# Patient Record
Sex: Male | Born: 1960 | Race: Black or African American | Hispanic: No | Marital: Single | Smoking: Never smoker
Health system: Southern US, Community
[De-identification: ages and names within clinical notes are randomized; demographics above are authoritative.]

## PROBLEM LIST (undated history)

## (undated) DIAGNOSIS — M545 Low back pain, unspecified: Secondary | ICD-10-CM

## (undated) DIAGNOSIS — G8929 Other chronic pain: Secondary | ICD-10-CM

## (undated) DIAGNOSIS — C189 Malignant neoplasm of colon, unspecified: Secondary | ICD-10-CM

## (undated) DIAGNOSIS — I82409 Acute embolism and thrombosis of unspecified deep veins of unspecified lower extremity: Secondary | ICD-10-CM

## (undated) DIAGNOSIS — M542 Cervicalgia: Secondary | ICD-10-CM

## (undated) HISTORY — DX: Other chronic pain: G89.29

## (undated) HISTORY — DX: Low back pain, unspecified: M54.50

## (undated) HISTORY — DX: Low back pain: M54.5

## (undated) HISTORY — PX: CERVICAL FUSION: SHX112

## (undated) HISTORY — DX: Acute embolism and thrombosis of unspecified deep veins of unspecified lower extremity: I82.409

---

## 1997-08-23 ENCOUNTER — Emergency Department (HOSPITAL_COMMUNITY): Admission: EM | Admit: 1997-08-23 | Discharge: 1997-08-23 | Payer: Self-pay | Admitting: *Deleted

## 1997-11-22 ENCOUNTER — Inpatient Hospital Stay (HOSPITAL_COMMUNITY): Admission: RE | Admit: 1997-11-22 | Discharge: 1997-11-23 | Payer: Self-pay | Admitting: Neurosurgery

## 2003-11-25 ENCOUNTER — Emergency Department (HOSPITAL_COMMUNITY): Admission: EM | Admit: 2003-11-25 | Discharge: 2003-11-25 | Payer: Self-pay | Admitting: Emergency Medicine

## 2004-05-20 ENCOUNTER — Ambulatory Visit (HOSPITAL_COMMUNITY): Admission: RE | Admit: 2004-05-20 | Discharge: 2004-05-20 | Payer: Self-pay | Admitting: Family Medicine

## 2005-06-15 ENCOUNTER — Encounter: Payer: Self-pay | Admitting: Neurosurgery

## 2009-12-24 ENCOUNTER — Emergency Department (HOSPITAL_COMMUNITY): Admission: EM | Admit: 2009-12-24 | Discharge: 2009-12-24 | Payer: Self-pay | Admitting: Emergency Medicine

## 2010-01-23 ENCOUNTER — Emergency Department (HOSPITAL_COMMUNITY)
Admission: EM | Admit: 2010-01-23 | Discharge: 2010-01-23 | Payer: Self-pay | Source: Home / Self Care | Admitting: Emergency Medicine

## 2010-02-14 DIAGNOSIS — I82409 Acute embolism and thrombosis of unspecified deep veins of unspecified lower extremity: Secondary | ICD-10-CM

## 2010-02-14 HISTORY — DX: Acute embolism and thrombosis of unspecified deep veins of unspecified lower extremity: I82.409

## 2010-05-11 ENCOUNTER — Emergency Department (HOSPITAL_COMMUNITY): Payer: Self-pay

## 2010-05-11 ENCOUNTER — Emergency Department (HOSPITAL_COMMUNITY)
Admission: EM | Admit: 2010-05-11 | Discharge: 2010-05-11 | Disposition: A | Discharge status: Home / Self Care | Payer: Self-pay | Attending: Emergency Medicine | Admitting: Emergency Medicine

## 2010-05-11 DIAGNOSIS — M79609 Pain in unspecified limb: Secondary | ICD-10-CM | POA: Insufficient documentation

## 2010-05-11 DIAGNOSIS — M5430 Sciatica, unspecified side: Secondary | ICD-10-CM | POA: Insufficient documentation

## 2010-06-25 ENCOUNTER — Observation Stay (HOSPITAL_COMMUNITY)
Admission: EM | Admit: 2010-06-25 | Discharge: 2010-06-28 | Disposition: A | Discharge status: Home / Self Care | Payer: Self-pay | Attending: Internal Medicine | Admitting: Internal Medicine

## 2010-06-25 ENCOUNTER — Emergency Department (HOSPITAL_COMMUNITY): Payer: Self-pay

## 2010-06-25 DIAGNOSIS — I825Y9 Chronic embolism and thrombosis of unspecified deep veins of unspecified proximal lower extremity: Secondary | ICD-10-CM | POA: Insufficient documentation

## 2010-06-25 DIAGNOSIS — I824Y9 Acute embolism and thrombosis of unspecified deep veins of unspecified proximal lower extremity: Principal | ICD-10-CM | POA: Insufficient documentation

## 2010-06-25 DIAGNOSIS — M545 Low back pain, unspecified: Secondary | ICD-10-CM | POA: Insufficient documentation

## 2010-06-25 DIAGNOSIS — D696 Thrombocytopenia, unspecified: Secondary | ICD-10-CM | POA: Insufficient documentation

## 2010-06-25 DIAGNOSIS — G8929 Other chronic pain: Secondary | ICD-10-CM | POA: Insufficient documentation

## 2010-06-25 LAB — DIFFERENTIAL
Basophils Relative: 0 % (ref 0–1)
Monocytes Absolute: 0.5 10*3/uL (ref 0.1–1.0)
Monocytes Relative: 8 % (ref 3–12)
Neutro Abs: 3.3 10*3/uL (ref 1.7–7.7)

## 2010-06-25 LAB — CBC
HCT: 41.9 % (ref 39.0–52.0)
Hemoglobin: 13.7 g/dL (ref 13.0–17.0)
MCH: 30.8 pg (ref 26.0–34.0)
MCHC: 32.7 g/dL (ref 30.0–36.0)
MCV: 94.2 fL (ref 78.0–100.0)

## 2010-06-25 LAB — PROTIME-INR: Prothrombin Time: 12.4 seconds (ref 11.6–15.2)

## 2010-06-26 LAB — BASIC METABOLIC PANEL
CO2: 29 mEq/L (ref 19–32)
GFR calc Af Amer: >60 mL/min (ref >60)
Glucose, Bld: 96 mg/dL (ref 70–99)
Potassium: 4.3 mEq/L (ref 3.5–5.1)
Sodium: 136 mEq/L (ref 135–145)

## 2010-06-26 LAB — DIFFERENTIAL
Lymphocytes Relative: 39 % (ref 12–46)
Lymphs Abs: 2.4 10*3/uL (ref 0.7–4.0)
Monocytes Relative: 10 % (ref 3–12)
Neutro Abs: 3 10*3/uL (ref 1.7–7.7)
Neutrophils Relative %: 48 % (ref 43–77)

## 2010-06-26 LAB — CBC
HCT: 41.7 % (ref 39.0–52.0)
Hemoglobin: 13.7 g/dL (ref 13.0–17.0)
MCH: 30.9 pg (ref 26.0–34.0)
MCV: 94.1 fL (ref 78.0–100.0)
RBC: 4.43 MIL/uL (ref 4.22–5.81)

## 2010-06-26 NOTE — H&P (Signed)
Danny Fox, Danny Fox               ACCOUNT NO.:  [id number]  MEDICAL RECORD NO.:  [id number]           PATIENT TYPE:  O  LOCATION:  A206                          FACILITY:  [facility]  PHYSICIAN:  Hillery Aldo, M.D.   DATE OF BIRTH:  Nov 19, 1960  DATE OF ADMISSION:  06/25/2010 DATE OF DISCHARGE:  LH                             HISTORY & PHYSICAL   PRIMARY CARE PHYSICIAN:  None.  CHIEF COMPLAINT:  Right lower extremity pain.  HISTORY OF PRESENT ILLNESS:  The patient is a 49 year old male with 5- day history of worsening right lower extremity pain.  The patient initially thought he may have pulled a calf muscle but denies any reports of recent injury to the lower extremity.  He has not had any recent travel and there is no family history of clotting disorders. Upon initial evaluation in the emergency department, an ultrasound confirmed right lower extremity DVT and he has been referred to the Hospitalist Service for further evaluation and initiation of treatment. The patient denies any recent fever, chills, chest pain, or dyspnea.  PAST MEDICAL HISTORY:  Chronic neck and lower back pain status post anterior fusion.  FAMILY HISTORY:  The patient's mother is alive at 5 and has diabetes and hypertension.  The patient's stepfather is alive at 54, but he is unaware of his biological father's past medical history.  He has one brother with prostate cancer and a sister with pancreatic cancer.  Other siblings are healthy.  SOCIAL HISTORY:  The patient is divorced and currently lives with his mother.  He is lifelong nonsmoker.  He drinks one 40 ounce beer approximately every 2 days.  He occasionally smokes marijuana.  He is currently unemployed.  ALLERGIES:  No known drug allergies.  CURRENT MEDICATIONS:  None.  REVIEW OF SYSTEMS:  CONSTITUTIONAL:  No fever or chills.  No weight loss or weight gain.  HEENT:  No complaints.  CARDIOVASCULAR:  No chest pain or dysrhythmia.  RESPIRATORY:   No shortness of breath or cough.  GI:  No nausea, vomiting, diarrhea, melena, or hematochezia.  GU:  No dysuria or hematuria.  MUSCULOSKELETAL:  No back pain.  Right lower extremity pain.  Comprehensive 14-point review of systems is otherwise unremarkable.  PHYSICAL EXAMINATION:  VITAL SIGNS:  Temperature 97.5, blood pressure 142/89, pulse 50, respirations 20, O2 saturation 100% on room air. GENERAL:  Well-developed, well-nourished African American male in no acute distress. HEENT:  Normocephalic, atraumatic.  PERRL.  EOMI.  Oropharynx is clear. NECK:  Supple, no thyromegaly, no lymphadenopathy, no jugular venous distention. CHEST:  Lungs clear to auscultation bilaterally.  Good air movement. HEART:  Regular rate, rhythm.  No murmurs, rubs, or gallops. ABDOMEN:  Soft, nontender, nondistended with normoactive bowel sounds. EXTREMITIES:  Swelling and tenderness to the right lower extremity. Pedal pulses are 2+ bilaterally. NEUROLOGIC:  The patient is alert and oriented x3.  Cranial nerves II- XII are grossly intact.  Nonfocal.  DATA REVIEW:  Right lower extremity ultrasound shows DVT involving the popliteal vein below the knee and the posterior tibial vein to the calf. This most likely chronic as there is prominent  collateral vein and partial recannulization of the popliteal vein and posterior tibial veins.  LABORATORY DATA:  White blood cell count is 6.4, hemoglobin 13.7, hematocrit 41.9, platelets 134.  ASSESSMENT AND PLAN: 1. 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  We will check hypercoagulability profile prior to the     initiation of therapeutic anticoagulation since this is an     unprovoked event.  We will teach the patient how to self-administer     Lovenox and obtain a social worker consultation for help with     sitting up appropriate hospital followup and access to  Lovenox     since he is currently uninsured. 2. Thrombocytopenia:  The patient has a mild thrombocytopenia.  No     evidence of bleeding diathesis at this time. 3. Chronic lower back pain:  Currently asymptomatic.  Time spent on admission including face-to-face time equals approximately 45 minutes.     Hillery Aldo, M.D.     CR/MEDQ  D:  06/25/2010  T:  06/26/2010  Job:  [id number]  Electronically Signed by Hillery Aldo M.D. on 06/26/2010 04:31:16 PM

## 2010-06-27 LAB — CBC
HCT: 43.7 % (ref 39.0–52.0)
Hemoglobin: 14.4 g/dL (ref 13.0–17.0)
MCV: 93.4 fL (ref 78.0–100.0)
RDW: 13.4 % (ref 11.5–15.5)
WBC: 7.7 10*3/uL (ref 4.0–10.5)

## 2010-06-27 LAB — DIFFERENTIAL
Eosinophils Relative: 1 % (ref 0–5)
Lymphocytes Relative: 37 % (ref 12–46)
Lymphs Abs: 2.9 10*3/uL (ref 0.7–4.0)
Monocytes Absolute: 0.7 10*3/uL (ref 0.1–1.0)
Neutro Abs: 4 10*3/uL (ref 1.7–7.7)

## 2010-06-28 LAB — CBC
HCT: 45.1 % (ref 39.0–52.0)
Hemoglobin: 14.7 g/dL (ref 13.0–17.0)
MCHC: 32.6 g/dL (ref 30.0–36.0)
RBC: 4.83 MIL/uL (ref 4.22–5.81)
WBC: 6.5 10*3/uL (ref 4.0–10.5)

## 2010-06-28 LAB — LUPUS ANTICOAGULANT PANEL
PTT Lupus Anticoagulant: 47.3 secs — ABNORMAL HIGH (ref 30.0–45.6)
PTTLA 4:1 Mix: 45.1 secs (ref 30.0–45.6)

## 2010-06-28 LAB — PROTEIN S ACTIVITY: Protein S Activity: 120 % (ref 69–129)

## 2010-06-28 LAB — ANTITHROMBIN III: AntiThromb III Func: 106 % (ref 76–126)

## 2010-06-28 LAB — PROTEIN S, TOTAL: Protein S Ag, Total: 120 % (ref 60–150)

## 2010-06-28 LAB — DIFFERENTIAL
Basophils Absolute: 0 10*3/uL (ref 0.0–0.1)
Basophils Relative: 0 % (ref 0–1)
Lymphocytes Relative: 41 % (ref 12–46)
Monocytes Absolute: 0.7 10*3/uL (ref 0.1–1.0)
Neutro Abs: 3.1 10*3/uL (ref 1.7–7.7)
Neutrophils Relative %: 47 % (ref 43–77)

## 2010-06-28 LAB — PROTEIN C, TOTAL: Protein C, Total: 91 % (ref 72–160)

## 2010-06-28 LAB — CARDIOLIPIN ANTIBODIES, IGG, IGM, IGA
Anticardiolipin IgA: 7 APL U/mL — ABNORMAL LOW (ref <22)
Anticardiolipin IgG: 6 GPL U/mL — ABNORMAL LOW (ref <23)

## 2010-06-28 LAB — BETA-2-GLYCOPROTEIN I ABS, IGG/M/A
Beta-2 Glyco I IgG: 0 G Units (ref <20)
Beta-2-Glycoprotein I IgA: 8 A Units (ref <20)
Beta-2-Glycoprotein I IgM: 9 M Units (ref <20)

## 2010-06-28 LAB — PROTIME-INR: Prothrombin Time: 14.1 seconds (ref 11.6–15.2)

## 2010-06-29 NOTE — Discharge Summary (Signed)
NAMEJAHNI, NAZAR               ACCOUNT NO.:  [id number]  MEDICAL RECORD NO.:  [id number]           PATIENT TYPE:  O  LOCATION:  A206                          FACILITY:  [facility]  PHYSICIAN:  Hillery Aldo, M.D.   DATE OF BIRTH:  06/16/1960  DATE OF ADMISSION:  06/25/2010 DATE OF DISCHARGE:  05/14/2012LH                              DISCHARGE SUMMARY   PRIMARY CARE PHYSICIAN:  None.  The patient is being referred to the [facility] for hospital followup.  DISCHARGE DIAGNOSES: 1. Acute on chronic deep vein thrombosis of the right lower extremity. 2. Mild transient thrombocytopenia. 3. Chronic lower back pain.  DISCHARGE MEDICATIONS: 1. Coumadin 7.5 mg p.o. daily or as directed by primary care provider. 2. Lovenox 90 mg subcutaneously q.12 hours until instructed to     discontinue. 3. Naprosyn 220 mg p.o. q.6 hours p.r.n. pain. 4. Oxycodone 5-10 mg p.o. q 4 hours p.r.n. pain.  CONSULTATIONS:  None.  BRIEF ADMISSION HISTORY OF PRESENT ILLNESS:  The patient is a 50 year old male with known past medical history of right lower extremity injury or DVT risk factors, who presented to the hospital with pain and swelling of the right lower extremity.  He had a confirmed DVT on initial evaluation in the emergency department, subsequently was referred to the Hospitalist Service for further evaluation and treatment.  For full details, please see my dictated H and P.  PROCEDURES AND DIAGNOSTIC STUDIES: 1. Right lower extremity venous ultrasound on Jun 25, 2010 showed DVT     involving the popliteal vein below the knee and paraposterior     tibial veins of calf.  Suspicion of chronic DVT as there was a     prominent collateral vein and partial recannulization of the     popliteal vein and posterior tibial veins.  DISCHARGE LABORATORY VALUES:  White blood cell count was 6.5, hemoglobin 14.7, hematocrit 45.1, platelets 177.  PT was 14.1 with an INR of 1.07. Chemistries performed  on Jun 26, 2010 were unremarkable and within normal limits.  A hypercoagulable panel is pending at the time of this dictation, although the homocysteine is back at 9.8.  HOSPITAL COURSE BY PROBLEM: 1. Right lower extremity DVT:  The patient was admitted to the     hospital and put on Lovenox and Coumadin.  He was provided with     extensive teaching with regard to dietary restrictions on Coumadin     and had a self-administer Lovenox.  The patient was kept over the     weekend due to not having appropriate hospital followup and to     attempt to obtain a supply of Lovenox for him as he is currently     without insurance.  At this point, the patient will be set up for     hospital followup with the [facility] and we will obtain a     supply of Lovenox to the drug assistance program. 2. Thrombocytopenia:  The patient had mild transient thrombocytopenia     which has resolved. 3. Chronic lower back pain:  He can continue on Naprosyn p.r.n.  DISPOSITION:  The patient is medically stable and will be discharged home.  CONDITION ON DISCHARGE:  Stable.  Time spent coordinating care for discharge and discharge instructions including face-to-face time equals 20 minutes.  DISCHARGE DIET:  Regular.  DISCHARGE INSTRUCTIONS:  Follow up at the [facility] at a scheduled appointment time.  Increase activity slowly.     Hillery Aldo, M.D.     CR/MEDQ  D:  06/28/2010  T:  06/28/2010  Job:  [id number]  Electronically Signed by Hillery Aldo M.D. on 06/29/2010 04:41:51 PM

## 2010-06-30 ENCOUNTER — Encounter: Payer: Self-pay | Admitting: Adult Health

## 2010-06-30 ENCOUNTER — Ambulatory Visit (INDEPENDENT_AMBULATORY_CARE_PROVIDER_SITE_OTHER): Payer: Self-pay | Admitting: Adult Health

## 2010-06-30 ENCOUNTER — Ambulatory Visit (INDEPENDENT_AMBULATORY_CARE_PROVIDER_SITE_OTHER): Payer: Self-pay | Admitting: *Deleted

## 2010-06-30 VITALS — BP 126/81 | HR 51 | Ht 69.0 in | Wt 200.0 lb

## 2010-06-30 DIAGNOSIS — I82409 Acute embolism and thrombosis of unspecified deep veins of unspecified lower extremity: Secondary | ICD-10-CM

## 2010-06-30 DIAGNOSIS — I824Y9 Acute embolism and thrombosis of unspecified deep veins of unspecified proximal lower extremity: Secondary | ICD-10-CM

## 2010-06-30 DIAGNOSIS — Z01818 Encounter for other preprocedural examination: Secondary | ICD-10-CM

## 2010-06-30 DIAGNOSIS — Z7901 Long term (current) use of anticoagulants: Secondary | ICD-10-CM | POA: Insufficient documentation

## 2010-06-30 MED ORDER — OXYCODONE HCL 10 MG PO TABS: 10.0000 mg | ORAL_TABLET | ORAL | Status: DC | PRN

## 2010-06-30 NOTE — Patient Instructions (Signed)
Your physician recommends that you continue on your current medications as directed. Please refer to the Current Medication list given to you today.  Your physician recommends that you schedule a follow-up appointment in: 1 year  

## 2010-06-30 NOTE — Assessment & Plan Note (Signed)
He is doing as expected for chronic DVT. Pain is not well controlled.  Dr. Daleen Squibb has seen and examined this patient.  He is to see him in one year, continue in the coumadin clinic.  He will be given a Rx for oxycodone to assist in pain control. He is to take it every 3 hours.

## 2010-06-30 NOTE — Progress Notes (Signed)
HPI:  [name] is a 50 y/o AAM we are seeing to be established in our clinic in the setting of coumadin therapy for DVT. He was diagnosed in May of 2012 after acute onset of Right Leg pain and swelling. Presented to [facility] and doppler US demonstrated DVT in the popliteal vein below the knee and the paired posterior tibial veins in the calf. It was suspected that this represented chronic DVT.  A hypercoagulable panel was completed and found to be negative.  He comes today and has seen Vashti Hey RN for coumadin INR check and found to have a level of 2.0.  Lovenox bridging is stopped.  He has more complaints of Right LE pain and swelling which is quite difficult for him.  He is otherwise without complaint of DOE, Chest pain or dizziness.,  No Known Allergies  Current Outpatient Prescriptions  Medication Sig Dispense Refill  . naproxen (NAPROSYN) 250 MG tablet Take 250 mg by mouth 2 (two) times daily with a meal.        . Oxycodone HCl 10 MG TABS Take 1 tablet by mouth as needed.        . warfarin (COUMADIN) 5 MG tablet Take by mouth as directed.        . warfarin (COUMADIN) 7.5 MG tablet Take 7.5 mg by mouth daily.          Past Medical History  Diagnosis Date  . Chronic deep vein thrombosis of lower leg     right  . Thrombocytopenia   . Chronic lower back pain     Past Surgical History  Procedure Date  . Anterior lumbar fusion     No family history on file.  History   Social History  . Marital Status: Single    Spouse Name: N/A    Number of Children: N/A  . Years of Education: N/A   Occupational History  . Not on file.   Social History Main Topics  . Smoking status: Current Some Day Smoker  . Smokeless tobacco: Never Used  . Alcohol Use: Yes  . Drug Use: Yes  . Sexually Active: Not on file   Other Topics Concern  . Not on file   Social History Narrative  . No narrative on file    GUY:QIHKVQ of systems complete and found to be negative unless listed  above  PHYSICAL EXAM BP 126/81  Pulse 51  Ht 5\' 9"  (1.753 m)  Wt 200 lb (90.719 kg)  BMI 29.53 kg/m2  SpO2 97% General: Well developed, well nourished, in no acute distress Head: Eyes PERRLA, No xanthomas.   Normal cephalic and atramatic  Lungs: Clear bilaterally to auscultation and percussion. Heart: HRRR S1 S2,.  Pulses are 2+ & equal.            No carotid bruit. No JVD.  No abdominal bruits. No femoral bruits. Abdomen: Bowel sounds are positive, abdomen soft and non-tender without masses or                  Hernia's noted. Msk:  Back normal, normal gait. Normal strength and tone for age. Extremities: No clubbing, cyanosis . Right leg acutely painful, with mild edema.                      No warmth.   Neuro: Alert and oriented X 3. Psych:  Good affect, responds appropriately EKG: Bradycardia 50 bpm.    ASSESSMENT AND PLAN

## 2010-07-01 ENCOUNTER — Ambulatory Visit (INDEPENDENT_AMBULATORY_CARE_PROVIDER_SITE_OTHER): Payer: Self-pay | Admitting: *Deleted

## 2010-07-01 DIAGNOSIS — Z7901 Long term (current) use of anticoagulants: Secondary | ICD-10-CM

## 2010-07-01 DIAGNOSIS — I824Y9 Acute embolism and thrombosis of unspecified deep veins of unspecified proximal lower extremity: Secondary | ICD-10-CM

## 2010-07-05 ENCOUNTER — Telehealth: Payer: Self-pay | Admitting: *Deleted

## 2010-07-05 NOTE — Telephone Encounter (Signed)
PT IS SCHEDULED FOR USE TO DO HIS FIRST FEW INR CHECKS UNTIL HE GETS STABLE THEN TO GO TO HEALTH DEPARTMENT FROM THEN ON. PER CALL HEALTH DEPARTMENT HAS ALREADY BEEN NOTIFIED AND WAITING FOR THE CALL TO GET SET UP.

## 2010-07-07 ENCOUNTER — Ambulatory Visit (INDEPENDENT_AMBULATORY_CARE_PROVIDER_SITE_OTHER): Payer: Self-pay | Admitting: *Deleted

## 2010-07-07 DIAGNOSIS — I824Y9 Acute embolism and thrombosis of unspecified deep veins of unspecified proximal lower extremity: Secondary | ICD-10-CM

## 2010-07-07 DIAGNOSIS — Z7901 Long term (current) use of anticoagulants: Secondary | ICD-10-CM

## 2010-07-22 ENCOUNTER — Ambulatory Visit (INDEPENDENT_AMBULATORY_CARE_PROVIDER_SITE_OTHER): Payer: Self-pay | Admitting: *Deleted

## 2010-07-22 DIAGNOSIS — Z7901 Long term (current) use of anticoagulants: Secondary | ICD-10-CM

## 2010-07-22 DIAGNOSIS — I824Y9 Acute embolism and thrombosis of unspecified deep veins of unspecified proximal lower extremity: Secondary | ICD-10-CM

## 2010-07-22 LAB — POCT INR: INR: 4

## 2010-07-22 MED ORDER — WARFARIN SODIUM 5 MG PO TABS: 5.0000 mg | ORAL_TABLET | ORAL | Status: DC

## 2010-08-02 ENCOUNTER — Encounter: Payer: Self-pay | Admitting: *Deleted

## 2010-08-02 ENCOUNTER — Ambulatory Visit (INDEPENDENT_AMBULATORY_CARE_PROVIDER_SITE_OTHER): Payer: Self-pay | Admitting: Adult Health

## 2010-08-02 ENCOUNTER — Ambulatory Visit (INDEPENDENT_AMBULATORY_CARE_PROVIDER_SITE_OTHER): Payer: Self-pay | Admitting: *Deleted

## 2010-08-02 ENCOUNTER — Encounter: Payer: Self-pay | Admitting: Adult Health

## 2010-08-02 VITALS — BP 141/76 | HR 51 | Ht 69.0 in | Wt 202.0 lb

## 2010-08-02 DIAGNOSIS — I82409 Acute embolism and thrombosis of unspecified deep veins of unspecified lower extremity: Secondary | ICD-10-CM

## 2010-08-02 DIAGNOSIS — I824Y9 Acute embolism and thrombosis of unspecified deep veins of unspecified proximal lower extremity: Secondary | ICD-10-CM

## 2010-08-02 DIAGNOSIS — Z7901 Long term (current) use of anticoagulants: Secondary | ICD-10-CM

## 2010-08-02 LAB — POCT INR: INR: 2.5

## 2010-08-02 MED ORDER — OXYCODONE HCL 10 MG PO TABS: 10.0000 mg | ORAL_TABLET | Freq: Four times a day (QID) | ORAL | Status: DC | PRN

## 2010-08-02 NOTE — Progress Notes (Signed)
HPI:  Danny Fox is a 50 y/o AAM of Dr. Daleen Squibb we saw one month ago to be established in our office on  coumadin therapy for DVT. He was diagnosed in May of 2012 after acute onset of Right Leg pain and swelling. Presented to [facility] and doppler US demonstrated DVT in the popliteal vein below the knee and the paired posterior tibial veins in the calf. It was suspected that this represented chronic DVT.  A hypercoagulable panel was completed and found to be negative. On last visit, he was started on oxycodone for pain control by Dr. Daleen Squibb who is now his cardiologist. He is here for follow-up for evaluation of same.  He continues to have right calf pain that although less, continues to cause some limping with walking and sometimes at night.  He states that the oxycodone has been helpful to him, but he has run out.  Otherwise he is without complaint.   No Known Allergies  Current Outpatient Prescriptions  Medication Sig Dispense Refill  . warfarin (COUMADIN) 5 MG tablet Take 1 tablet (5 mg total) by mouth as directed.  45 tablet  3  . naproxen (NAPROSYN) 250 MG tablet Take 250 mg by mouth 2 (two) times daily with a meal.        . Oxycodone HCl 10 MG TABS Take 1 tablet by mouth as needed.        . Oxycodone HCl 10 MG TABS Take 1 tablet (10 mg total) by mouth every 6 (six) hours as needed.  90 each  0  . DISCONTD: Oxycodone HCl 10 MG TABS Take 1 tablet (10 mg total) by mouth every 3 (three) hours as needed.  90 each  0    Past Medical History  Diagnosis Date  . Chronic deep vein thrombosis of lower leg     right  . Thrombocytopenia   . Chronic lower back pain     Past Surgical History  Procedure Date  . Anterior lumbar fusion     No family history on file.  History   Social History  . Marital Status: Single    Spouse Name: N/A    Number of Children: N/A  . Years of Education: N/A   Occupational History  . Not on file.   Social History Main Topics  . Smoking status: Current Some  Day Smoker  . Smokeless tobacco: Never Used  . Alcohol Use: Yes  . Drug Use: Yes  . Sexually Active: Not on file   Other Topics Concern  . Not on file   Social History Narrative  . No narrative on file    AOZ:HYQMVH of systems complete and found to be negative unless listed above  PHYSICAL EXAM BP 141/76  Pulse 51  Ht 5\' 9"  (1.753 m)  Wt 202 lb (91.627 kg)  BMI 29.83 kg/m2  SpO2 97% General: Well developed, well nourished, in no acute distress Head: Eyes PERRLA, No xanthomas.   Normal cephalic and atramatic  Lungs: Clear bilaterally to auscultation and percussion. Heart: HRRR S1 S2,.  Pulses are 2+ & equal.            No carotid bruit. No JVD.  No abdominal bruits. No femoral bruits. Abdomen: Bowel sounds are positive, abdomen soft and non-tender without masses or                  Hernia's noted. Msk:  Back normal, normal gait. Normal strength and tone for age. Extremities: No clubbing, cyanosis .  Right leg acutely painful, with mild edema.                      No warmth.   Neuro: Alert and oriented X 3. Psych:  Good affect, responds appropriately EKG: Bradycardia 50 bpm.

## 2010-08-02 NOTE — Assessment & Plan Note (Addendum)
He continues the pain in the right calf and lower leg. Will give him on refill of the oxycodone with less number of pills.  He can take it prn only for leg discomfort.  I think that we can change him to [facility] for pain control should he still require this on next visit. He will see Dr. Daleen Squibb on next visit. If he continues to have significant pain, will have ultrasound completed for relook, and do ABI's. He will continue on coumadin for now.

## 2010-08-02 NOTE — Patient Instructions (Addendum)
Your physician recommends that you schedule a follow-up appointment in: 2 months with Dr Daleen Squibb

## 2010-08-23 ENCOUNTER — Encounter: Payer: Self-pay | Admitting: *Deleted

## 2010-10-08 ENCOUNTER — Encounter: Payer: Self-pay | Admitting: Cardiology

## 2010-10-08 ENCOUNTER — Encounter: Payer: Self-pay | Admitting: *Deleted

## 2010-10-12 ENCOUNTER — Ambulatory Visit: Payer: Self-pay | Admitting: Cardiology

## 2010-10-14 ENCOUNTER — Ambulatory Visit: Payer: Self-pay | Admitting: Cardiology

## 2010-10-14 ENCOUNTER — Ambulatory Visit (INDEPENDENT_AMBULATORY_CARE_PROVIDER_SITE_OTHER): Payer: Self-pay | Admitting: *Deleted

## 2010-10-14 DIAGNOSIS — I824Y9 Acute embolism and thrombosis of unspecified deep veins of unspecified proximal lower extremity: Secondary | ICD-10-CM

## 2010-10-14 DIAGNOSIS — Z7901 Long term (current) use of anticoagulants: Secondary | ICD-10-CM

## 2010-10-14 LAB — POCT INR: INR: 2

## 2010-10-19 ENCOUNTER — Encounter: Payer: Self-pay | Admitting: Cardiology

## 2010-10-19 ENCOUNTER — Ambulatory Visit (INDEPENDENT_AMBULATORY_CARE_PROVIDER_SITE_OTHER): Payer: Self-pay | Admitting: Cardiology

## 2010-10-19 DIAGNOSIS — M545 Low back pain: Secondary | ICD-10-CM

## 2010-10-19 DIAGNOSIS — I82409 Acute embolism and thrombosis of unspecified deep veins of unspecified lower extremity: Secondary | ICD-10-CM

## 2010-10-19 DIAGNOSIS — Z7901 Long term (current) use of anticoagulants: Secondary | ICD-10-CM

## 2010-10-19 DIAGNOSIS — M542 Cervicalgia: Secondary | ICD-10-CM | POA: Insufficient documentation

## 2010-10-19 DIAGNOSIS — I8010 Phlebitis and thrombophlebitis of unspecified femoral vein: Secondary | ICD-10-CM

## 2010-10-19 DIAGNOSIS — G8929 Other chronic pain: Secondary | ICD-10-CM

## 2010-10-19 MED ORDER — TRAMADOL-ACETAMINOPHEN 37.5-325 MG PO TABS: ORAL_TABLET | ORAL | Status: DC

## 2010-10-19 NOTE — Assessment & Plan Note (Addendum)
Patient is doing quite well with anticoagulation.  The impression that he had chronic venous thrombosis when he first presented would suggest the need for a longer course of warfarin or perhaps indefinite treatment; however, all his disease was below the knee, and he is relatively young to commit to lifelong therapy for a single episode.  We will repeat his venous ultrasound and tentatively plan a one-year course of anticoagulation.  Continuing right leg pain will be evaluated with an x-ray of the knee and the above ultrasound study.  While he could have a popliteal fossa cyst, that should have been apparent on his previous study.  His discomfort is not really related to movement, so disease of the knee is unlikely.  I have provided him with a prescription for Ultram to treat his pain.  He denies having any more oxycodone.  Naprosyn does not provide relief.

## 2010-10-19 NOTE — Assessment & Plan Note (Signed)
Chronic neck and left her upper extremity symptoms would ideally be reevaluated by the physician who performed his previous surgery.  We are investigating to determine if we can locate that individual.  Otherwise, referral will be made to a neurosurgeon or to Dr. Nickola Major.

## 2010-10-19 NOTE — Patient Instructions (Addendum)
Your physician has recommended you make the following change in your medication: start taking Ultracet 1 to 2 tablets three times daily as needed  Your physician recommends that you complete 3 hemoccult cards, please return to office when complete   Your physician recommends that you have a Ultra sound of right leg and X-ray of right knee  Your physician recommends that you schedule a follow-up appointment in: 6 months

## 2010-10-19 NOTE — Assessment & Plan Note (Deleted)
Deep vein thrombosis

## 2010-10-19 NOTE — Progress Notes (Signed)
HPI : Mr. Gildersleeve returns to the office for continued assessment and treatment of deep vein thrombosis.  His initial episode required hospitalization 4 months ago.  He has done well on warfarin with typically therapeutic INRs.  Platelet count, which was slightly low in the hospital, has recovered to normal.  Hemoglobin and hematocrit have been normal.  Stools have not been tested for occult blood.  Patient's principal problem is neck and left arm pain, which have been chronic.  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  He did well for a number of years, but pain subsequently recurred.  He has an application for disability pending and is being assisted by an attorney.  Current Outpatient Prescriptions on File Prior to Visit  Medication Sig Dispense Refill  . warfarin (COUMADIN) 5 MG tablet Take 1 tablet (5 mg total) by mouth as directed.  45 tablet  3     No Known Allergies    Past medical history, social history, and family history reviewed and updated.  PHYSICAL EXAM: General-Well developed; no acute distress Body habitus-proportionate weight and height Neck-No JVD; no carotid bruits Lungs-clear lung fields; resonant to percussion Cardiovascular-normal PMI; normal S1 and S2; bradycardic Abdomen-normal bowel sounds; soft and non-tender without masses or organomegaly Musculoskeletal-No deformities, no cyanosis or clubbing Neurologic-Normal cranial nerves; symmetric strength and tone Skin-Warm, no significant lesions Extremities-distal pulses intact; no edema; no fullness in the right popliteal fossa; full range of motion in the knee   ASSESSMENT AND PLAN:

## 2010-10-21 ENCOUNTER — Encounter: Payer: Self-pay | Admitting: Cardiology

## 2010-11-01 ENCOUNTER — Encounter (INDEPENDENT_AMBULATORY_CARE_PROVIDER_SITE_OTHER): Payer: Self-pay

## 2010-11-01 DIAGNOSIS — Z7901 Long term (current) use of anticoagulants: Secondary | ICD-10-CM

## 2010-11-11 ENCOUNTER — Ambulatory Visit (HOSPITAL_COMMUNITY)
Admission: RE | Admit: 2010-11-11 | Discharge: 2010-11-11 | Disposition: A | Discharge status: Home / Self Care | Payer: Self-pay | Source: Ambulatory Visit | Attending: Cardiology | Admitting: Cardiology

## 2010-11-11 ENCOUNTER — Ambulatory Visit (INDEPENDENT_AMBULATORY_CARE_PROVIDER_SITE_OTHER): Payer: Self-pay | Admitting: *Deleted

## 2010-11-11 ENCOUNTER — Encounter: Payer: Self-pay | Admitting: *Deleted

## 2010-11-11 DIAGNOSIS — I82819 Embolism and thrombosis of superficial veins of unspecified lower extremities: Secondary | ICD-10-CM | POA: Insufficient documentation

## 2010-11-11 DIAGNOSIS — Z7901 Long term (current) use of anticoagulants: Secondary | ICD-10-CM

## 2010-11-11 DIAGNOSIS — I82409 Acute embolism and thrombosis of unspecified deep veins of unspecified lower extremity: Secondary | ICD-10-CM

## 2010-11-11 DIAGNOSIS — I824Y9 Acute embolism and thrombosis of unspecified deep veins of unspecified proximal lower extremity: Secondary | ICD-10-CM

## 2010-11-11 LAB — POCT INR: INR: 1.1

## 2010-11-11 MED ORDER — WARFARIN SODIUM 5 MG PO TABS: 5.0000 mg | ORAL_TABLET | ORAL | Status: DC

## 2010-11-18 ENCOUNTER — Emergency Department (HOSPITAL_COMMUNITY)
Admission: EM | Admit: 2010-11-18 | Discharge: 2010-11-18 | Disposition: A | Discharge status: Home / Self Care | Payer: Self-pay | Attending: Emergency Medicine | Admitting: Emergency Medicine

## 2010-11-18 ENCOUNTER — Encounter (HOSPITAL_COMMUNITY): Payer: Self-pay | Admitting: Emergency Medicine

## 2010-11-18 DIAGNOSIS — G8929 Other chronic pain: Secondary | ICD-10-CM | POA: Insufficient documentation

## 2010-11-18 DIAGNOSIS — R5381 Other malaise: Secondary | ICD-10-CM | POA: Insufficient documentation

## 2010-11-18 DIAGNOSIS — Z79899 Other long term (current) drug therapy: Secondary | ICD-10-CM | POA: Insufficient documentation

## 2010-11-18 DIAGNOSIS — R5383 Other fatigue: Secondary | ICD-10-CM | POA: Insufficient documentation

## 2010-11-18 DIAGNOSIS — M549 Dorsalgia, unspecified: Secondary | ICD-10-CM | POA: Insufficient documentation

## 2010-11-18 DIAGNOSIS — R209 Unspecified disturbances of skin sensation: Secondary | ICD-10-CM | POA: Insufficient documentation

## 2010-11-18 DIAGNOSIS — M542 Cervicalgia: Secondary | ICD-10-CM | POA: Insufficient documentation

## 2010-11-18 DIAGNOSIS — Z981 Arthrodesis status: Secondary | ICD-10-CM | POA: Insufficient documentation

## 2010-11-18 DIAGNOSIS — M25519 Pain in unspecified shoulder: Secondary | ICD-10-CM | POA: Insufficient documentation

## 2010-11-18 DIAGNOSIS — Z86718 Personal history of other venous thrombosis and embolism: Secondary | ICD-10-CM | POA: Insufficient documentation

## 2010-11-18 LAB — PROTIME-INR
INR: 2 — ABNORMAL HIGH (ref 0.00–1.49)
Prothrombin Time: 23 seconds — ABNORMAL HIGH (ref 11.6–15.2)

## 2010-11-18 MED ORDER — OXYCODONE-ACETAMINOPHEN 5-325 MG PO TABS: 1.0000 | ORAL_TABLET | Freq: Four times a day (QID) | ORAL | Status: AC | PRN

## 2010-11-18 MED ORDER — OXYCODONE-ACETAMINOPHEN 5-325 MG PO TABS: 1.0000 | ORAL_TABLET | Freq: Four times a day (QID) | ORAL | Status: DC | PRN

## 2010-11-18 MED ORDER — OXYCODONE-ACETAMINOPHEN 5-325 MG PO TABS
1.0000 | ORAL_TABLET | ORAL | Status: AC
  Administered 2010-11-18: 1 via ORAL
  Filled 2010-11-18: qty 1

## 2010-11-18 NOTE — ED Notes (Signed)
Pt c/o chronic lower back and neck pain. This episode x 2 weeks and didn't sleep last night due to pain.  Took advil last night with no relief. Denies new injury
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
## 2010-11-18 NOTE — ED Provider Notes (Signed)
Medical screening examination/treatment/procedure(s) were conducted as a shared visit with resident (s) and myself.  I personally evaluated the patient during the encounter   Pt well appearing, no distress, reports acute on chronic back pain without any new neuro deficits.  No incontinence reported  Joya Gaskins, MD 11/18/10 2218

## 2010-11-22 ENCOUNTER — Telehealth: Payer: Self-pay

## 2010-11-22 ENCOUNTER — Ambulatory Visit (INDEPENDENT_AMBULATORY_CARE_PROVIDER_SITE_OTHER): Payer: Self-pay | Admitting: *Deleted

## 2010-11-22 DIAGNOSIS — Z7901 Long term (current) use of anticoagulants: Secondary | ICD-10-CM

## 2010-11-22 DIAGNOSIS — I82409 Acute embolism and thrombosis of unspecified deep veins of unspecified lower extremity: Secondary | ICD-10-CM

## 2010-11-22 DIAGNOSIS — I824Y9 Acute embolism and thrombosis of unspecified deep veins of unspecified proximal lower extremity: Secondary | ICD-10-CM

## 2010-11-22 NOTE — Telephone Encounter (Addendum)
OK to provide Rx for Ultracet as per office note. 1-2 tablets TID PRN  #60 refill X2  Duplex of lower extremities->no DVT. D-dimer, CMet, CBC in 6 months

## 2010-11-22 NOTE — Telephone Encounter (Signed)
**Note De-Identified Danny Fox Obfuscation** S: Pt. came in for a Coumadin check and stated that he never received RX for Ultracet (take 1 to 2 tablets TID for knee pain). B: On last OV with Dr. Dietrich Pates on 10-19-10 pt. was advised to have ultra sound and x-ray of right knee due to pain. U.S. found in chart but not x-ray (I left message for pt. to call office concerning x-ray) A: Pt. states he did not receive RX for Ultracet and wants it filled now stating he continues to have pain in right knee. R: Pt. advised that we will contact him with Dr. Marvel Plan recommendation.

## 2010-11-23 ENCOUNTER — Other Ambulatory Visit: Payer: Self-pay

## 2010-11-23 MED ORDER — TRAMADOL-ACETAMINOPHEN 37.5-325 MG PO TABS: 1.0000 | ORAL_TABLET | ORAL | Status: AC | PRN

## 2010-12-03 ENCOUNTER — Encounter: Payer: Self-pay | Admitting: *Deleted

## 2010-12-13 ENCOUNTER — Ambulatory Visit: Payer: Self-pay | Admitting: Cardiology

## 2010-12-13 ENCOUNTER — Encounter: Payer: Self-pay | Admitting: *Deleted

## 2010-12-28 ENCOUNTER — Telehealth: Payer: Self-pay | Admitting: *Deleted

## 2010-12-28 NOTE — Telephone Encounter (Signed)
Cancelled patient's appointment for tomorrow.  Advised mother that we will be glad to see Danny Fox for cardiology issues, however cannot treat back pain and he would have to see a PCP, [facility] or ER for this issue.  She verbalized understanding.

## 2010-12-29 ENCOUNTER — Encounter: Payer: Self-pay | Admitting: *Deleted

## 2010-12-29 ENCOUNTER — Ambulatory Visit: Payer: Self-pay | Admitting: Cardiology

## 2011-01-05 ENCOUNTER — Ambulatory Visit (INDEPENDENT_AMBULATORY_CARE_PROVIDER_SITE_OTHER): Payer: Self-pay | Admitting: *Deleted

## 2011-01-05 DIAGNOSIS — I824Y9 Acute embolism and thrombosis of unspecified deep veins of unspecified proximal lower extremity: Secondary | ICD-10-CM

## 2011-01-05 DIAGNOSIS — I82409 Acute embolism and thrombosis of unspecified deep veins of unspecified lower extremity: Secondary | ICD-10-CM

## 2011-01-05 DIAGNOSIS — Z7901 Long term (current) use of anticoagulants: Secondary | ICD-10-CM

## 2011-02-02 ENCOUNTER — Encounter: Payer: Self-pay | Admitting: *Deleted

## 2011-02-03 ENCOUNTER — Emergency Department (HOSPITAL_COMMUNITY)
Admission: EM | Admit: 2011-02-03 | Discharge: 2011-02-03 | Disposition: A | Discharge status: Home / Self Care | Payer: Self-pay | Attending: Emergency Medicine | Admitting: Emergency Medicine

## 2011-02-03 ENCOUNTER — Encounter (HOSPITAL_COMMUNITY): Payer: Self-pay | Admitting: Emergency Medicine

## 2011-02-03 DIAGNOSIS — M549 Dorsalgia, unspecified: Secondary | ICD-10-CM

## 2011-02-03 DIAGNOSIS — M545 Low back pain, unspecified: Secondary | ICD-10-CM | POA: Insufficient documentation

## 2011-02-03 DIAGNOSIS — Z7901 Long term (current) use of anticoagulants: Secondary | ICD-10-CM | POA: Insufficient documentation

## 2011-02-03 MED ORDER — HYDROCODONE-ACETAMINOPHEN 5-325 MG PO TABS: 1.0000 | ORAL_TABLET | ORAL | Status: AC | PRN

## 2011-02-03 MED ORDER — DEXAMETHASONE 6 MG PO TABS: ORAL_TABLET | ORAL | Status: AC
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
## 2011-02-03 NOTE — ED Notes (Signed)
Patient c/o lower back pain x2 weeks. Reports hx of chronic back pain.

## 2011-02-07 NOTE — ED Provider Notes (Signed)
Medical screening examination/treatment/procedure(s) were performed by non-physician practitioner and as supervising physician I was immediately available for consultation/collaboration.   Debanhi Blaker M Daisee Centner, DO 02/07/11 0924 

## 2011-02-09 ENCOUNTER — Ambulatory Visit (INDEPENDENT_AMBULATORY_CARE_PROVIDER_SITE_OTHER): Payer: Self-pay | Admitting: *Deleted

## 2011-02-09 DIAGNOSIS — I82409 Acute embolism and thrombosis of unspecified deep veins of unspecified lower extremity: Secondary | ICD-10-CM

## 2011-02-09 DIAGNOSIS — I824Y9 Acute embolism and thrombosis of unspecified deep veins of unspecified proximal lower extremity: Secondary | ICD-10-CM

## 2011-02-09 DIAGNOSIS — Z7901 Long term (current) use of anticoagulants: Secondary | ICD-10-CM

## 2011-02-09 LAB — POCT INR: INR: 2.7

## 2011-02-09 MED ORDER — WARFARIN SODIUM 5 MG PO TABS: 5.0000 mg | ORAL_TABLET | Freq: Every day | ORAL | Status: DC

## 2011-03-09 ENCOUNTER — Encounter: Payer: Self-pay | Admitting: *Deleted

## 2011-03-21 ENCOUNTER — Encounter: Payer: Self-pay | Admitting: *Deleted

## 2011-03-23 ENCOUNTER — Ambulatory Visit (INDEPENDENT_AMBULATORY_CARE_PROVIDER_SITE_OTHER): Payer: Self-pay | Admitting: *Deleted

## 2011-03-23 DIAGNOSIS — I82409 Acute embolism and thrombosis of unspecified deep veins of unspecified lower extremity: Secondary | ICD-10-CM

## 2011-03-23 DIAGNOSIS — I824Y9 Acute embolism and thrombosis of unspecified deep veins of unspecified proximal lower extremity: Secondary | ICD-10-CM

## 2011-03-23 DIAGNOSIS — Z7901 Long term (current) use of anticoagulants: Secondary | ICD-10-CM

## 2011-03-23 LAB — POCT INR: INR: 2

## 2011-04-28 ENCOUNTER — Ambulatory Visit (INDEPENDENT_AMBULATORY_CARE_PROVIDER_SITE_OTHER): Payer: Self-pay | Admitting: *Deleted

## 2011-04-28 ENCOUNTER — Encounter: Payer: Self-pay | Admitting: Adult Health

## 2011-04-28 ENCOUNTER — Ambulatory Visit (INDEPENDENT_AMBULATORY_CARE_PROVIDER_SITE_OTHER): Payer: Self-pay | Admitting: Adult Health

## 2011-04-28 VITALS — BP 156/91 | HR 52 | Resp 18 | Ht 69.0 in | Wt 209.0 lb

## 2011-04-28 DIAGNOSIS — I82409 Acute embolism and thrombosis of unspecified deep veins of unspecified lower extremity: Secondary | ICD-10-CM

## 2011-04-28 DIAGNOSIS — Z7901 Long term (current) use of anticoagulants: Secondary | ICD-10-CM

## 2011-04-28 DIAGNOSIS — I10 Essential (primary) hypertension: Secondary | ICD-10-CM

## 2011-04-28 DIAGNOSIS — I824Y9 Acute embolism and thrombosis of unspecified deep veins of unspecified proximal lower extremity: Secondary | ICD-10-CM

## 2011-04-28 LAB — POCT INR: INR: 3.1

## 2011-04-28 NOTE — Patient Instructions (Signed)
Your physician recommends that you schedule a follow-up appointment in: 6 months  

## 2011-04-28 NOTE — Assessment & Plan Note (Signed)
Ultrasound of right LE demonstrated no evidence for right lower extremity DVT. Thrombus in the right popliteal vein and upper calf veins have resolved. Per Dr. Marvel Plan last note, he wants to have him stay on coumadin for one year. Since he was diagnosed in Sept of 2012, he will stay on coumadin for another 6 months. He will see Dr.Rothbart at that time and discuss stopping anticoagulation. He is NOT given medication refill for pain management. He is advised to seek a PCP.
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
## 2011-06-27 ENCOUNTER — Ambulatory Visit: Payer: Self-pay | Admitting: Cardiology

## 2011-10-06 ENCOUNTER — Encounter (HOSPITAL_COMMUNITY): Payer: Self-pay | Admitting: *Deleted

## 2011-10-06 ENCOUNTER — Emergency Department (HOSPITAL_COMMUNITY)
Admission: EM | Admit: 2011-10-06 | Discharge: 2011-10-06 | Disposition: A | Discharge status: Home / Self Care | Payer: Medicaid Other | Attending: Emergency Medicine | Admitting: Emergency Medicine

## 2011-10-06 DIAGNOSIS — Z86718 Personal history of other venous thrombosis and embolism: Secondary | ICD-10-CM | POA: Insufficient documentation

## 2011-10-06 DIAGNOSIS — Z7901 Long term (current) use of anticoagulants: Secondary | ICD-10-CM | POA: Insufficient documentation

## 2011-10-06 DIAGNOSIS — IMO0002 Reserved for concepts with insufficient information to code with codable children: Secondary | ICD-10-CM | POA: Insufficient documentation

## 2011-10-06 DIAGNOSIS — G8929 Other chronic pain: Secondary | ICD-10-CM

## 2011-10-06 DIAGNOSIS — M5416 Radiculopathy, lumbar region: Secondary | ICD-10-CM

## 2011-10-06 DIAGNOSIS — M542 Cervicalgia: Secondary | ICD-10-CM | POA: Insufficient documentation

## 2011-10-06 DIAGNOSIS — Z833 Family history of diabetes mellitus: Secondary | ICD-10-CM | POA: Insufficient documentation

## 2011-10-06 HISTORY — DX: Other chronic pain: G89.29

## 2011-10-06 HISTORY — DX: Cervicalgia: M54.2

## 2011-10-06 MED ORDER — OXYCODONE-ACETAMINOPHEN 5-325 MG PO TABS: 1.0000 | ORAL_TABLET | ORAL | Status: AC | PRN

## 2011-10-06 MED ORDER — OXYCODONE-ACETAMINOPHEN 5-325 MG PO TABS
1.0000 | ORAL_TABLET | Freq: Once | ORAL | Status: AC
  Administered 2011-10-06: 1 via ORAL
  Filled 2011-10-06: qty 1

## 2011-10-06 MED ORDER — DIAZEPAM 5 MG PO TABS: 5.0000 mg | ORAL_TABLET | Freq: Three times a day (TID) | ORAL | Status: AC | PRN

## 2011-10-06 NOTE — ED Notes (Signed)
Patient with no complaints at this time. Respirations even and unlabored. Skin warm/dry. Discharge instructions reviewed with patient at this time. Patient given opportunity to voice concerns/ask questions. Patient discharged at this time and left Emergency Department with steady gait.   

## 2011-10-06 NOTE — ED Notes (Signed)
Pt c/o pain in his neck and lower back radiating down his left leg with numbness.
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
## 2011-10-06 NOTE — ED Provider Notes (Signed)
Medical screening examination/treatment/procedure(s) were performed by non-physician practitioner and as supervising physician I was immediately available for consultation/collaboration.   Carleene Cooper III, MD 10/06/11 [phone]

## 2011-11-28 ENCOUNTER — Emergency Department (HOSPITAL_COMMUNITY)
Admission: EM | Admit: 2011-11-28 | Discharge: 2011-11-28 | Disposition: A | Discharge status: Home / Self Care | Payer: Medicaid Other | Attending: Emergency Medicine | Admitting: Emergency Medicine

## 2011-11-28 ENCOUNTER — Encounter (HOSPITAL_COMMUNITY): Payer: Self-pay | Admitting: *Deleted

## 2011-11-28 DIAGNOSIS — Z9889 Other specified postprocedural states: Secondary | ICD-10-CM | POA: Insufficient documentation

## 2011-11-28 DIAGNOSIS — M5416 Radiculopathy, lumbar region: Secondary | ICD-10-CM

## 2011-11-28 DIAGNOSIS — IMO0002 Reserved for concepts with insufficient information to code with codable children: Secondary | ICD-10-CM | POA: Insufficient documentation

## 2011-11-28 DIAGNOSIS — Z86718 Personal history of other venous thrombosis and embolism: Secondary | ICD-10-CM | POA: Insufficient documentation

## 2011-11-28 MED ORDER — OXYCODONE-ACETAMINOPHEN 5-325 MG PO TABS
1.0000 | ORAL_TABLET | Freq: Once | ORAL | Status: AC
  Administered 2011-11-28: 1 via ORAL
  Filled 2011-11-28: qty 1

## 2011-11-28 MED ORDER — DIAZEPAM 5 MG PO TABS: 5.0000 mg | ORAL_TABLET | Freq: Two times a day (BID) | ORAL | Status: DC

## 2011-11-28 MED ORDER — OXYCODONE-ACETAMINOPHEN 5-325 MG PO TABS: 1.0000 | ORAL_TABLET | Freq: Four times a day (QID) | ORAL | Status: DC | PRN

## 2011-11-28 MED ORDER — PREDNISONE 20 MG PO TABS: 40.0000 mg | ORAL_TABLET | Freq: Every day | ORAL | Status: DC

## 2011-11-28 NOTE — ED Provider Notes (Signed)
History   This chart was scribed for Gwyneth Sprout, MD by Gerlean Ren. This patient was seen in room [facility]04/[facility]04 and the patient's care was started at 07:56.   CSN: [id number]  Arrival date & time 11/28/11  1478   First MD Initiated Contact with Patient 11/28/11 0755      Chief Complaint  Patient presents with  . Back Pain    (Consider location/radiation/quality/duration/timing/severity/associated sxs/prior treatment) Patient is a 51 y.o. male presenting with back pain. The history is provided by the patient. No language interpreter was used.  Back Pain  This is a chronic problem. The current episode started more than 2 days ago. The problem has not changed since onset.The pain is present in the lumbar spine. The quality of the pain is described as aching. The pain radiates to the left thigh, left knee and left foot. The pain is moderate. The symptoms are aggravated by bending, certain positions and twisting. The pain is the same all the time. Associated symptoms include numbness, abdominal pain and leg pain. Pertinent negatives include no chest pain, no bladder incontinence and no dysuria. He has tried NSAIDs for the symptoms. The treatment provided no relief.   Danny Fox is a 51 y.o. male with a h/o chronic lower back pain who presents to the Emergency Department complaining of 3 days of constant, aching, non-improving lower left back pain that radiates around left side to left lower abdomen and down entire left lower extremity to toes that began when pt bent down to tie shoes and is not improved by ibuprofen.  Pt is ambulatory with a limp.  Pt reports associated numbness in toes secondary to pain.  Pt denies fever, sore throat, visual disturbance, CP, cough, dyspnea, nausea, emesis, diarrhea, urinary symptoms, HA, weakness, and rash as associated symptoms.  Pt denies tobacco use but reports alcohol use.     Past Medical History  Diagnosis Date  . Deep vein thrombosis 2012   Right lower extremity venous ultrasound on Jun 25, 2010 showed DVT  involving the popliteal vein below the knee and paraposterior   tibial veins of calf.  Suspicion of chronic DVT as there was a    prominent collateral vein and partial recannulization of the  popliteal vein and posterior tibial veins  . Chronic lower back pain     and neck pain  . Chronic neck pain     Past Surgical History  Procedure Date  . Cervical fusion     Family History  Problem Relation Age of Onset  . Diabetes Other     History  Substance Use Topics  . Smoking status: Never Smoker   . Smokeless tobacco: Never Used  . Alcohol Use: 7.0 oz/week    14 drink(s) per week     beer one every 2 days      Review of Systems  Cardiovascular: Negative for chest pain.  Gastrointestinal: Positive for abdominal pain.  Genitourinary: Negative for bladder incontinence and dysuria.  Musculoskeletal: Positive for back pain.  Neurological: Positive for numbness.   A complete 10 system review of systems was obtained and all systems are negative except as noted in the HPI and PMH.   Allergies  Review of patient's allergies indicates no known allergies.  Home Medications   Current Outpatient Rx  Name Route Sig Dispense Refill  . WARFARIN SODIUM 5 MG PO TABS Oral Take 1 tablet (5 mg total) by mouth daily. 30 tablet 3    BP 105/65  Pulse  45  Temp 97.5 F (36.4 C)  Resp 20  SpO2 100%  Physical Exam  Nursing note and vitals reviewed. Constitutional: He is oriented to person, place, and time. He appears well-developed and well-nourished.  HENT:  Head: Normocephalic and atraumatic.  Eyes: EOM are normal.  Neck: Normal range of motion. No tracheal deviation present.  Cardiovascular: Normal rate, regular rhythm and normal heart sounds.   No murmur heard.      Normal distal pulses.  Pulmonary/Chest: Effort normal and breath sounds normal. He has no wheezes.  Abdominal: Soft. Bowel sounds are normal. He  exhibits no distension. There is no tenderness.  Musculoskeletal: Normal range of motion. He exhibits no edema.       Mild left paralumbar tenderness.  Neurological: He is alert and oriented to person, place, and time. Coordination normal.       2+ patellar reflexes bilaterally. Normal strength in bilateral lower extremities.   Skin: Skin is warm.  Psychiatric: He has a normal mood and affect.    ED Course  Procedures (including critical care time) DIAGNOSTIC STUDIES: Oxygen Saturation is 100% on room air, normal by my interpretation.    COORDINATION OF CARE: 08:02- Patient informed of clinical course, understands medical decision-making process, and agrees with plan.  Ordered PO percocet.    Labs Reviewed - No data to display No results found.   1. Lumbar radiculopathy       MDM   Pt with gradual onset of back pain suggestive of radiculopathy.  No neurovascular compromise and no incontinence.  Pt has no infectious sx, hx of CA  or other red flags concerning for pathologic back pain.  Pt is able to ambulate but is painful.  Normal strength and reflexes on exam.  Denies trauma. Will give pt pain control and to return for developement of above sx.  Pt was diagnosed with a DVT in 2012. He states that he has been off Coumadin for 3-4 months. Patient does not have a regular physician. At this time he is not complaining of worsening pain in the right leg and has pain in the left leg appears to be radicular. There is no suspicion for DVT in his left leg today. Discussed with the patient about following up whether he needs to continue Coumadin or not.   I personally performed the services described in this documentation, which was scribed in my presence.  The recorded information has been reviewed and considered.         Gwyneth Sprout, MD 11/28/11 [phone]

## 2011-11-28 NOTE — ED Notes (Signed)
Hx of chronic back problems, pt states that the pain became worse Friday and now radiates down left leg, denies any new injury.

## 2011-12-07 ENCOUNTER — Encounter: Payer: Self-pay | Admitting: *Deleted

## 2011-12-23 ENCOUNTER — Emergency Department (HOSPITAL_COMMUNITY)
Admission: EM | Admit: 2011-12-23 | Discharge: 2011-12-23 | Disposition: A | Discharge status: Home / Self Care | Payer: Medicaid Other | Attending: Emergency Medicine | Admitting: Emergency Medicine

## 2011-12-23 ENCOUNTER — Encounter (HOSPITAL_COMMUNITY): Payer: Self-pay

## 2011-12-23 DIAGNOSIS — R109 Unspecified abdominal pain: Secondary | ICD-10-CM | POA: Insufficient documentation

## 2011-12-23 DIAGNOSIS — M5430 Sciatica, unspecified side: Secondary | ICD-10-CM | POA: Insufficient documentation

## 2011-12-23 DIAGNOSIS — Z86718 Personal history of other venous thrombosis and embolism: Secondary | ICD-10-CM | POA: Insufficient documentation

## 2011-12-23 DIAGNOSIS — Z79899 Other long term (current) drug therapy: Secondary | ICD-10-CM | POA: Insufficient documentation

## 2011-12-23 DIAGNOSIS — G8929 Other chronic pain: Secondary | ICD-10-CM | POA: Insufficient documentation

## 2011-12-23 DIAGNOSIS — M5432 Sciatica, left side: Secondary | ICD-10-CM

## 2011-12-23 MED ORDER — OXYCODONE-ACETAMINOPHEN 5-325 MG PO TABS: 1.0000 | ORAL_TABLET | ORAL | Status: AC | PRN

## 2011-12-23 MED ORDER — ONDANSETRON 8 MG PO TBDP
8.0000 mg | ORAL_TABLET | Freq: Once | ORAL | Status: AC
  Administered 2011-12-23: 8 mg via ORAL
  Filled 2011-12-23: qty 1

## 2011-12-23 MED ORDER — DIAZEPAM 5 MG PO TABS: 5.0000 mg | ORAL_TABLET | Freq: Three times a day (TID) | ORAL | Status: DC | PRN

## 2011-12-23 MED ORDER — HYDROMORPHONE HCL PF 1 MG/ML IJ SOLN
1.0000 mg | Freq: Once | INTRAMUSCULAR | Status: AC
  Administered 2011-12-23: 1 mg via INTRAMUSCULAR
  Filled 2011-12-23: qty 1

## 2011-12-23 NOTE — ED Notes (Signed)
Pt reports low back pain that started on Monday, started after bending over to tie his shoes.
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
## 2011-12-23 NOTE — ED Notes (Signed)
Lower back pain that started on Monday,

## 2011-12-23 NOTE — ED Provider Notes (Addendum)
51 year old male has been having episodes of low back pain which at times radiate into the lower abdomen. He he was seen initially by physician's assistant who was worried that this could possibly represent aortic vascular disease. On exam, spine is nontender and abdomen is nontender. There no pulsatile masses and no bruits. Limited bedside ultrasound was done showing aorta of topical normal to slightly increased diameter with maximum diameter measured at 2.3 cm. This was a technically adequate study and I do not see any evidence of aortic aneurysm causing his pain. He'll be treated symptomatically.  Dione Booze, MD 12/23/11 1349  Medical screening examination/treatment/procedure(s) were conducted as a shared visit with non-physician practitioner(s) and myself.  I personally evaluated the patient during the encounter   Dione Booze, MD 12/23/11 1506

## 2012-03-27 ENCOUNTER — Ambulatory Visit (INDEPENDENT_AMBULATORY_CARE_PROVIDER_SITE_OTHER): Payer: Self-pay | Admitting: Cardiology

## 2012-03-27 ENCOUNTER — Encounter: Payer: Self-pay | Admitting: Cardiology

## 2012-03-27 VITALS — BP 117/67 | HR 51 | Ht 69.0 in | Wt 206.0 lb

## 2012-03-27 DIAGNOSIS — Z9119 Patient's noncompliance with other medical treatment and regimen: Secondary | ICD-10-CM

## 2012-03-27 DIAGNOSIS — I82409 Acute embolism and thrombosis of unspecified deep veins of unspecified lower extremity: Secondary | ICD-10-CM

## 2012-03-27 DIAGNOSIS — I10 Essential (primary) hypertension: Secondary | ICD-10-CM

## 2012-03-27 NOTE — Progress Notes (Signed)
HPI Danny Fox returns for evaluation and management of his history of right lower extremity DVT involving the popliteal vein. On his last visit his ultrasound showed clearing of the clot. He was asked remain on Coumadin for an additional 6 months. However, he stopped it on his own without notifying us in October. He did not bother to let us know. It was being followed here in our anticoagulation clinic. He says it was a money issue.  Other than some residual discomfort along the right medial lower leg, he has no complaints. He's been emergency room several times with back pain. He has been seeking pain meds and disability. He asked me for pain medication today.  Past Medical History  Diagnosis Date  . Deep vein thrombosis 2012    Right lower extremity venous ultrasound on Jun 25, 2010 showed DVT  involving the popliteal vein below the knee and paraposterior   tibial veins of calf.  Suspicion of chronic DVT as there was a    prominent collateral vein and partial recannulization of the  popliteal vein and posterior tibial veins  . Chronic lower back pain     and neck pain  . Chronic neck pain     Current Outpatient Prescriptions  Medication Sig Dispense Refill  . diazepam (VALIUM) 5 MG tablet Take 1 tablet (5 mg total) by mouth every 8 (eight) hours as needed (muscle spasm).  15 tablet  0   No current facility-administered medications for this visit.    No Known Allergies  Family History  Problem Relation Age of Onset  . Diabetes Other     History   Social History  . Marital Status: Single    Spouse Name: N/A    Number of Children: N/A  . Years of Education: N/A   Occupational History  . Unemployed    Social History Main Topics  . Smoking status: Never Smoker   . Smokeless tobacco: Never Used  . Alcohol Use: 7.0 oz/week    14 drink(s) per week     Comment: beer one every 2 days  . Drug Use: 1.00 per week    Special: Marijuana     Comment: 3-4 years ago per patient  .  Sexually Active: No   Other Topics Concern  . Not on file   Social History Narrative  . No narrative on file    ROS ALL NEGATIVE EXCEPT THOSE NOTED IN HPI  PE  General Appearance: well developed, well nourished in no acute distress HEENT: symmetrical face, PERRLA, good dentition  Neck: no JVD, thyromegaly, or adenopathy, trachea midline Chest: symmetric without deformity Cardiac: PMI non-displaced, RRR, normal S1, S2, no gallop or murmur Lung: clear to ausculation and percussion Vascular: all pulses full without bruits  Abdominal: nondistended, nontender, good bowel sounds, no HSM, no bruits Extremities: no cyanosis, clubbing or edema, no sign of DVT, no varicosities  Skin: normal color, no rashes Neuro: alert and oriented x 3, non-focal Pysch: normal affect  EKG  Sinus bradycardia, using 1 and aVL, no acute changes. He has early R-wave transition in the precordial leads. No old EKGs to compare.  BMET    Component Value Date/Time   NA 136 06/26/2010 0526   K 4.3 06/26/2010 0526   CL 101 06/26/2010 0526   CO2 29 06/26/2010 0526   GLUCOSE 96 06/26/2010 0526   BUN 11 06/26/2010 0526   CREATININE 1.26 06/26/2010 0526   CALCIUM 10.2 06/26/2010 0526   GFRNONAA >60 06/26/2010 4098  GFRAA  Value: >60        The eGFR has been calculated using the MDRD equation. This calculation has not been validated in all clinical situations. eGFR's persistently <60 mL/min signify possible Chronic Kidney Disease. 06/26/2010 0526    Lipid Panel  No results found for this basename: chol, trig, hdl, cholhdl, vldl, ldlcalc    CBC    Component Value Date/Time   WBC 6.5 06/28/2010 0454   RBC 4.83 06/28/2010 0454   HGB 14.7 06/28/2010 0454   HCT 45.1 06/28/2010 0454   PLT 177 06/28/2010 0454   MCV 93.4 06/28/2010 0454   MCH 30.4 06/28/2010 0454   MCHC 32.6 06/28/2010 0454   RDW 13.2 06/28/2010 0454   LYMPHSABS 2.7 06/28/2010 0454   MONOABS 0.7 06/28/2010 0454   EOSABS 0.1 06/28/2010 0454   BASOSABS 0.0  06/28/2010 0454

## 2012-03-27 NOTE — Patient Instructions (Addendum)
Your physician recommends that you schedule a follow-up appointment in: PRN 

## 2012-03-27 NOTE — Addendum Note (Signed)
Addended by: Derry Lory A on: 03/27/2012 02:59 PM   Modules accepted: Orders

## 2012-03-27 NOTE — Assessment & Plan Note (Signed)
Resolved. No further indication for Coumadin. Followup when necessary

## 2012-04-19 ENCOUNTER — Encounter (HOSPITAL_COMMUNITY): Payer: Self-pay | Admitting: *Deleted

## 2012-04-19 ENCOUNTER — Emergency Department (HOSPITAL_COMMUNITY)
Admission: EM | Admit: 2012-04-19 | Discharge: 2012-04-19 | Disposition: A | Discharge status: Home / Self Care | Payer: Medicare Other | Attending: Emergency Medicine | Admitting: Emergency Medicine

## 2012-04-19 DIAGNOSIS — G8929 Other chronic pain: Secondary | ICD-10-CM | POA: Insufficient documentation

## 2012-04-19 DIAGNOSIS — Z86718 Personal history of other venous thrombosis and embolism: Secondary | ICD-10-CM | POA: Insufficient documentation

## 2012-04-19 DIAGNOSIS — M545 Low back pain, unspecified: Secondary | ICD-10-CM | POA: Insufficient documentation

## 2012-04-19 MED ORDER — CYCLOBENZAPRINE HCL 10 MG PO TABS: 10.0000 mg | ORAL_TABLET | Freq: Two times a day (BID) | ORAL | Status: DC | PRN

## 2012-04-19 MED ORDER — HYDROCODONE-ACETAMINOPHEN 5-325 MG PO TABS: 1.0000 | ORAL_TABLET | ORAL | Status: DC | PRN

## 2012-04-19 NOTE — ED Notes (Signed)
Lower back pain x 1 wk radiating to left groin, chronic.

## 2012-04-19 NOTE — ED Provider Notes (Signed)
History     CSN: [id number]  Arrival date & time 04/19/12  8295   First MD Initiated Contact with Patient 04/19/12 [phone]      Chief Complaint  Patient presents with  . Back Pain   HPI Danny Fox is a 52 y.o. male who presents to the ED with back pain. The pain is located in the lower back.  He describes the pain as sharp, constant pain. The onset was gradual and has gotten much worse. He rates the pain as 9/10. The pain started one week ago. The pain radiates sometimes but today is only in the left lower back. Hx of chronic back pain. Has had cervical and lumbar fusion. Taking Advil and using heating pad without relief. Denies nausea, vomiting, UTI symptoms, loss of control of bladder or bowels. Last visit to ED was was 4 months ago. He called the doctor's office in [place] that did his surgery but did not have the money to pay up front so did not make appointment. Waiting for disability and then plans to go back. The history was provided by the patient.   Past Medical History  Diagnosis Date  . Deep vein thrombosis 2012    Right lower extremity venous ultrasound on Jun 25, 2010 showed DVT  involving the popliteal vein below the knee and paraposterior   tibial veins of calf.  Suspicion of chronic DVT as there was a    prominent collateral vein and partial recannulization of the  popliteal vein and posterior tibial veins  . Chronic lower back pain     and neck pain  . Chronic neck pain     Past Surgical History  Procedure Laterality Date  . Cervical fusion      Family History  Problem Relation Age of Onset  . Diabetes Other     History  Substance Use Topics  . Smoking status: Never Smoker   . Smokeless tobacco: Never Used  . Alcohol Use: 7.0 oz/week    14 drink(s) per week     Comment: beer one every 2 days      Review of Systems  Constitutional: Negative for fever, chills, diaphoresis and fatigue.  HENT: Negative for ear pain, congestion, sore throat, facial  swelling, neck pain, neck stiffness, dental problem and sinus pressure.   Eyes: Negative for photophobia, pain, discharge and visual disturbance.  Respiratory: Negative for cough, chest tightness and wheezing.   Cardiovascular: Negative for chest pain and palpitations.  Gastrointestinal: Negative for nausea, vomiting, abdominal pain, diarrhea, constipation and abdominal distention.  Genitourinary: Negative for dysuria, urgency, frequency, flank pain and difficulty urinating.  Musculoskeletal: Positive for back pain. Negative for myalgias and gait problem.  Skin: Negative for color change and rash.  Neurological: Negative for dizziness, seizures, speech difficulty, weakness, light-headedness, numbness and headaches.  Psychiatric/Behavioral: Negative for confusion and agitation. The patient is not nervous/anxious.     Allergies  Review of patient's allergies indicates no known allergies.  Home Medications   Current Outpatient Rx  Name  Route  Sig  Dispense  Refill  . ibuprofen (ADVIL,MOTRIN) 200 MG tablet   Oral   Take 400 mg by mouth every 6 (six) hours as needed for pain.         . Menthol, Topical Analgesic, (ICY HOT ADVANCED RELIEF EX)   Apply externally   Apply 1 application topically daily as needed (back pain).           BP 99/83  Pulse 57  Temp(Src) 98.4 F (36.9 C) (Oral)  Resp 16  Ht 5\' 9"  (1.753 m)  Wt 204 lb (92.534 kg)  BMI 30.11 kg/m2  SpO2 97%  Physical Exam  Nursing note and vitals reviewed. Constitutional: He is oriented to person, place, and time. He appears well-developed and well-nourished.  HENT:  Head: Normocephalic and atraumatic.  Eyes: EOM are normal. Pupils are equal, round, and reactive to light.  Neck: Normal range of motion. Neck supple.  Cardiovascular: Normal rate and regular rhythm.   Pulmonary/Chest: Effort normal and breath sounds normal. No respiratory distress.  Abdominal: Soft. There is no tenderness.  Musculoskeletal: Normal  range of motion. He exhibits no edema.  Straight leg raises without difficulty. Good strength and equal bilateral. Pedal pulses strong and equal bilateral, adequate circulation, good touch sensation. Pain left lumbar area with palpation and range of motion.  Neurological: He is alert and oriented to person, place, and time. He has normal strength and normal reflexes. No cranial nerve deficit or sensory deficit.  Pain with ambulation.  Skin: Skin is warm and dry.  Psychiatric: He has a normal mood and affect. His behavior is normal. Judgment and thought content normal.   Procedures Assessment: 52 y.o. male with low back pain   Chronic back pain  Plan:  Muscle Relaxant   Pain control    Discussed with the patient and all questioned fully answered. Discussed with the patient importance of trying to follow up with the doctor who did his surgery.    Medication List    TAKE these medications       cyclobenzaprine 10 MG tablet  Commonly known as:  FLEXERIL  Take 1 tablet (10 mg total) by mouth 2 (two) times daily as needed for muscle spasms.     HYDROcodone-acetaminophen 5-325 MG per tablet  Commonly known as:  NORCO/VICODIN  Take 1 tablet by mouth every 4 (four) hours as needed for pain.      ASK your doctor about these medications       ibuprofen 200 MG tablet  Commonly known as:  ADVIL,MOTRIN  Take 400 mg by mouth every 6 (six) hours as needed for pain.     ICY HOT ADVANCED RELIEF EX  Apply 1 application topically daily as needed (back pain).         [address] [place], [place] 04/19/12 [phone]

## 2012-04-20 NOTE — ED Provider Notes (Signed)
Medical screening examination/treatment/procedure(s) were performed by non-physician practitioner and as supervising physician I was immediately available for consultation/collaboration.  Donnetta Hutching, MD 04/20/12 1041

## 2012-06-07 ENCOUNTER — Telehealth: Payer: Self-pay

## 2012-06-07 NOTE — Telephone Encounter (Signed)
Pt was referred by Dr. Felecia Shelling for screening colonoscopy. I called to triage. He said he use to be on coumadin and he took himself off. Ov with Gerrit Halls, NP on 06/20/2012.

## 2012-06-12 IMAGING — CR DG CERVICAL SPINE COMPLETE 4+V
6 series · 6 of 6 positions shown · non-contrast
Comparison: 05/20/2004 cervical radiographs.

CLINICAL DATA: Chronic neck pain with numbness in the fingers of
the left hand.  No acute injury.  Prior cervical fusion.

CERVICAL SPINE - COMPLETE 4+ VIEW

[view not recorded (1 of 6)]
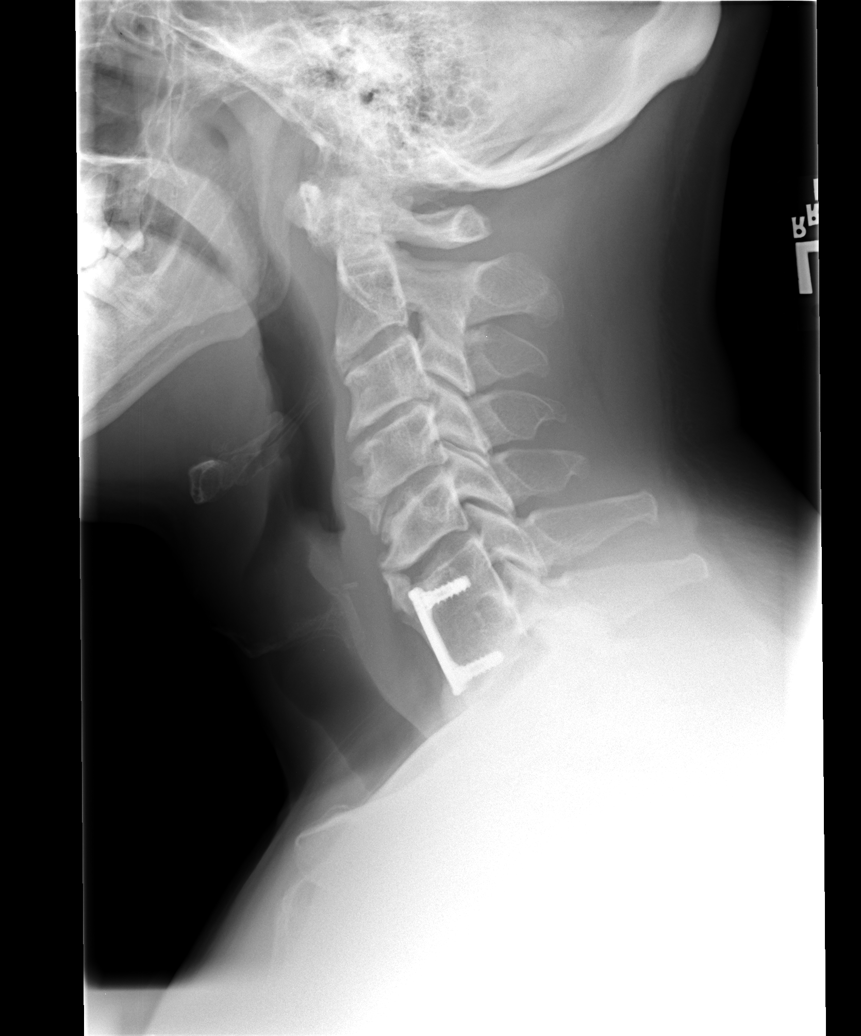

[view not recorded (2 of 6)]
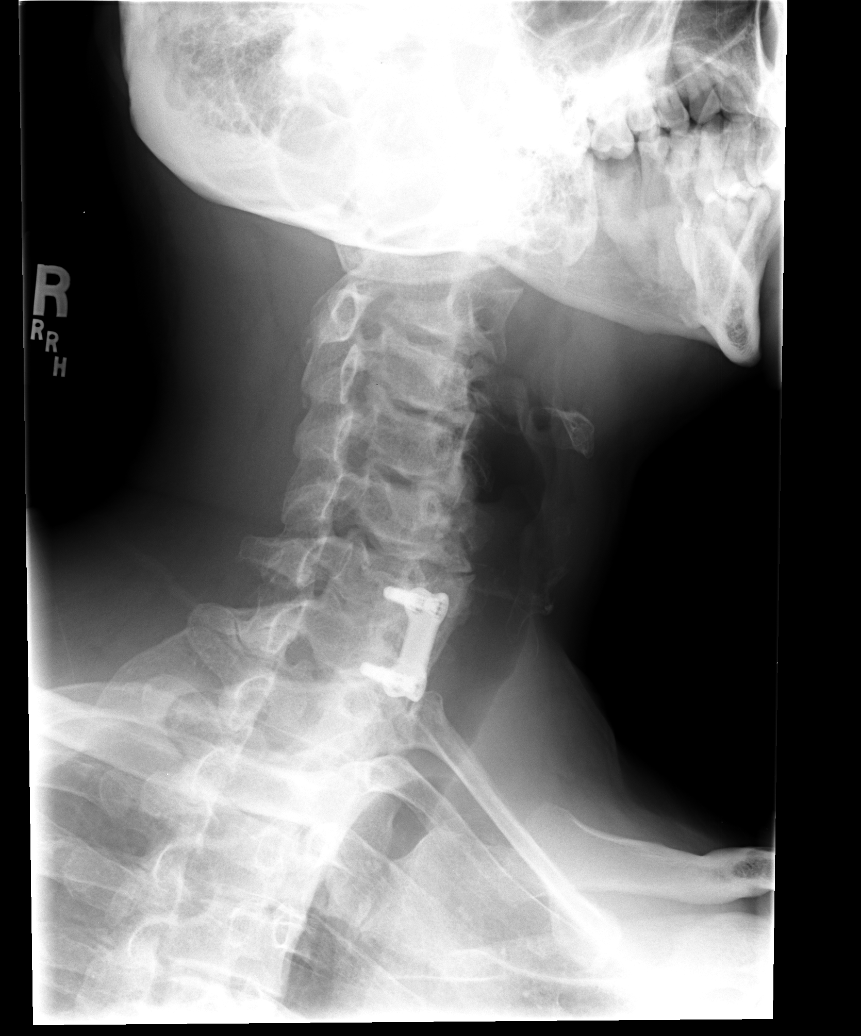

[view not recorded (3 of 6)]
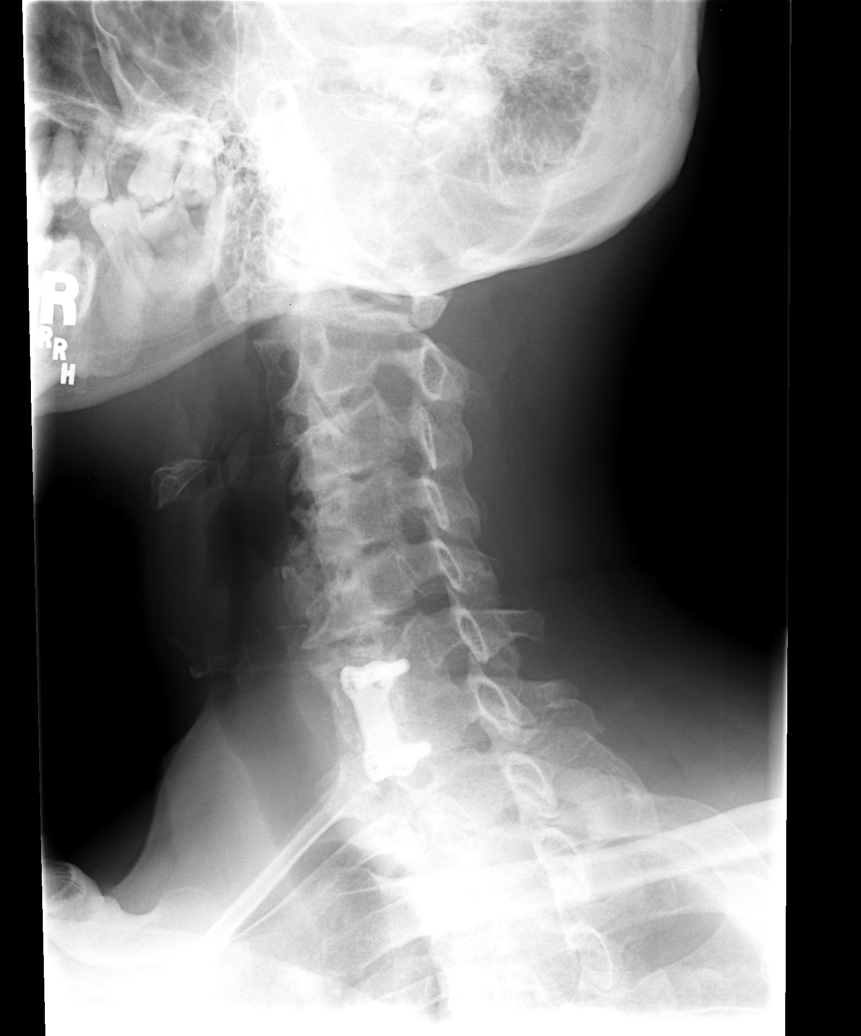

[view not recorded (4 of 6)]
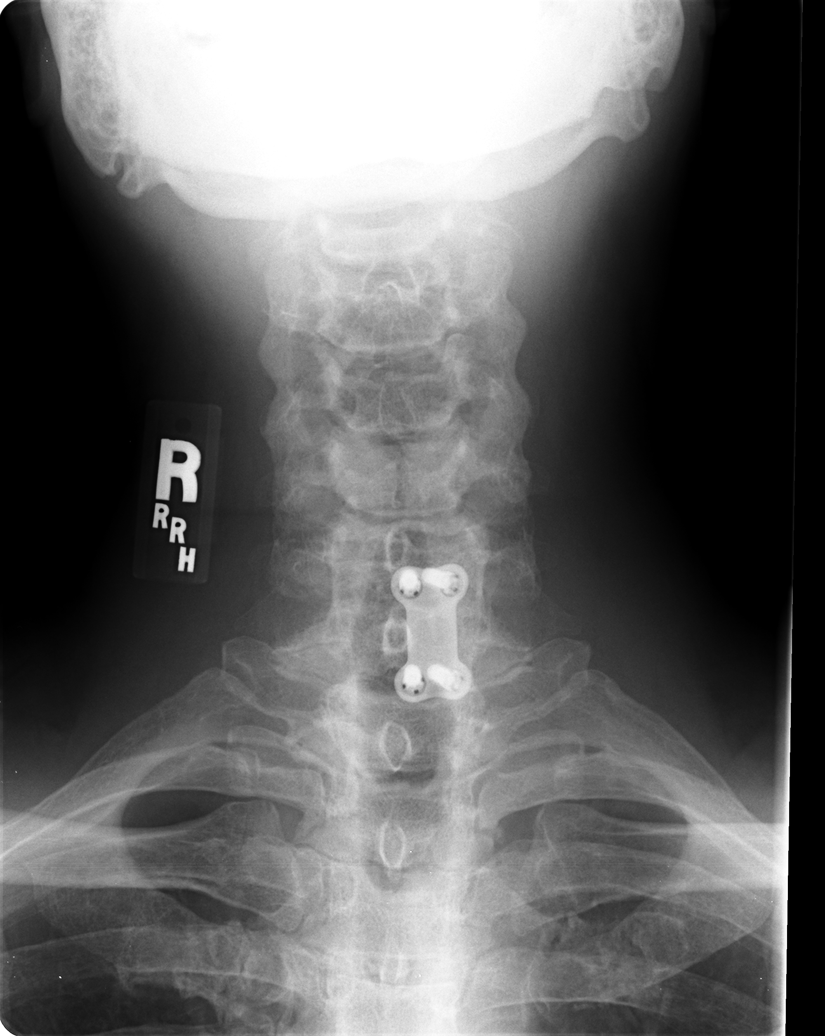

[view not recorded (5 of 6)]
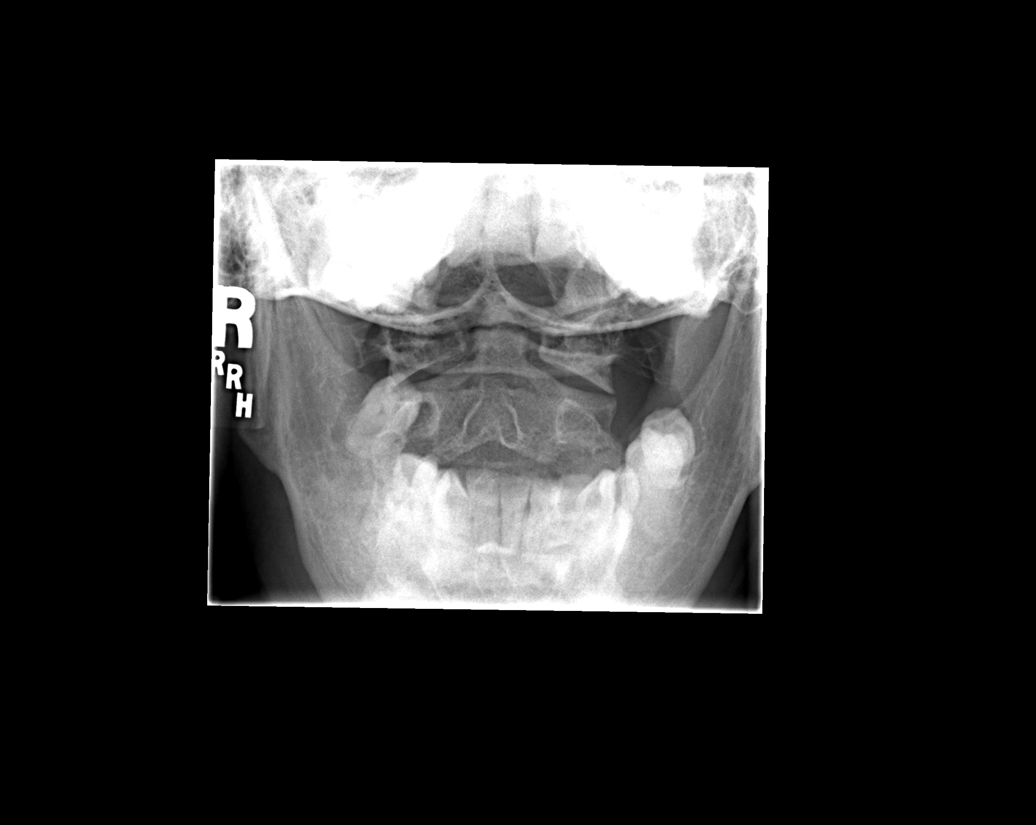

[view not recorded (6 of 6)]
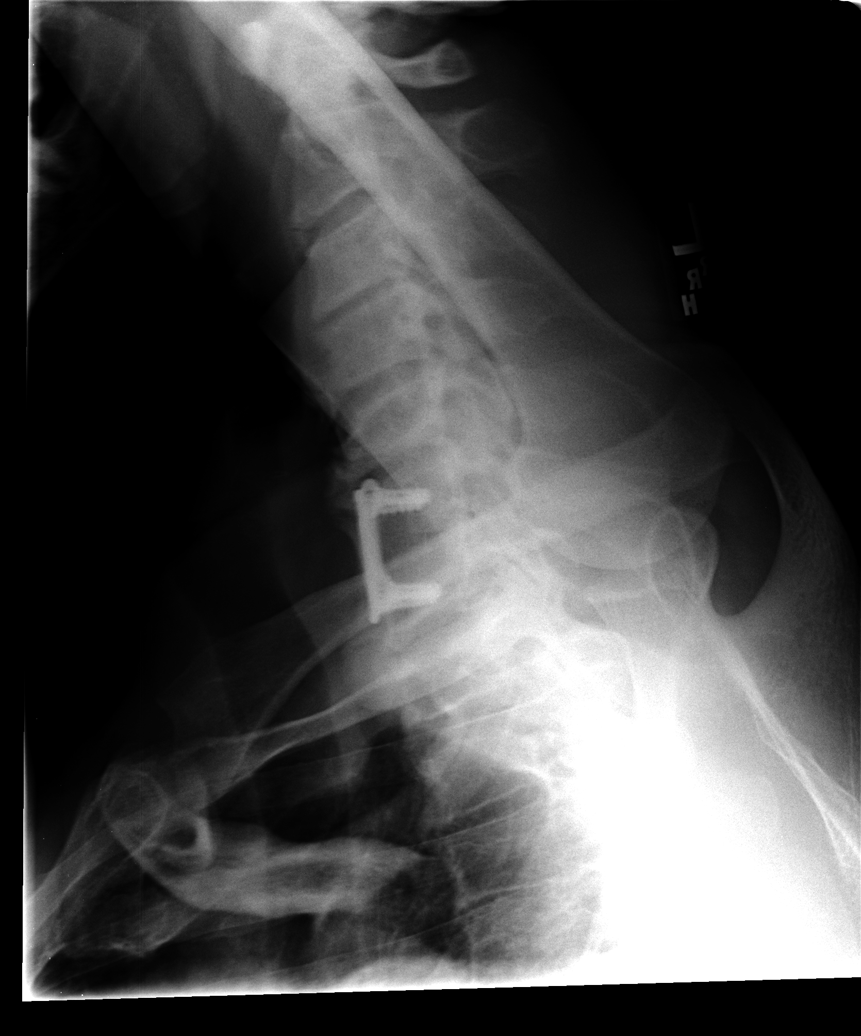

[6 of 6 positions shown; findings below may reference images not displayed]

FINDINGS: There is solid fusion at C6-C7 status post ACDF.  There
is no hardware displacement.  The cervical alignment is stable.
The prevertebral soft tissues appear normal.

Mildly progressive spondylosis is noted over this interval with
prominent anterior osteophytes at C4-C5 and C5-C6.  Oblique views
demonstrate mild biforaminal stenosis at C3-C4.  The additional
foramina appear patent.  No acute osseous findings are seen.
IMPRESSION: Intact hardware and stable alignment status post C6-C7 ACDF.
Mildly progressive spondylosis with mild biforaminal stenosis at C3-

## 2012-06-20 ENCOUNTER — Ambulatory Visit: Payer: Self-pay | Admitting: Gastroenterology

## 2012-07-03 ENCOUNTER — Telehealth: Payer: Self-pay | Admitting: Gastroenterology

## 2012-07-03 ENCOUNTER — Ambulatory Visit: Payer: Self-pay | Admitting: Gastroenterology

## 2012-07-03 NOTE — Telephone Encounter (Signed)
Pt was a no show

## 2012-07-10 NOTE — Telephone Encounter (Signed)
Please send routine letter for f/u.

## 2012-07-24 ENCOUNTER — Encounter: Payer: Self-pay | Admitting: Gastroenterology

## 2012-07-24 NOTE — Telephone Encounter (Signed)
MAILED LETTER °

## 2012-08-09 ENCOUNTER — Ambulatory Visit: Payer: Self-pay | Admitting: *Deleted

## 2012-08-31 ENCOUNTER — Emergency Department (HOSPITAL_COMMUNITY)
Admission: EM | Admit: 2012-08-31 | Discharge: 2012-08-31 | Disposition: A | Discharge status: Home / Self Care | Payer: Medicare Other | Attending: Emergency Medicine | Admitting: Emergency Medicine

## 2012-08-31 ENCOUNTER — Encounter (HOSPITAL_COMMUNITY): Payer: Self-pay | Admitting: *Deleted

## 2012-08-31 DIAGNOSIS — H9201 Otalgia, right ear: Secondary | ICD-10-CM

## 2012-08-31 DIAGNOSIS — Z86718 Personal history of other venous thrombosis and embolism: Secondary | ICD-10-CM | POA: Insufficient documentation

## 2012-08-31 DIAGNOSIS — M549 Dorsalgia, unspecified: Secondary | ICD-10-CM | POA: Insufficient documentation

## 2012-08-31 DIAGNOSIS — H9209 Otalgia, unspecified ear: Secondary | ICD-10-CM | POA: Insufficient documentation

## 2012-08-31 DIAGNOSIS — G8929 Other chronic pain: Secondary | ICD-10-CM | POA: Insufficient documentation

## 2012-08-31 MED ORDER — AMOXICILLIN 500 MG PO CAPS: 500.0000 mg | ORAL_CAPSULE | Freq: Three times a day (TID) | ORAL | Status: DC

## 2012-08-31 MED ORDER — HYDROCODONE-ACETAMINOPHEN 5-325 MG PO TABS: 1.0000 | ORAL_TABLET | ORAL | Status: DC | PRN

## 2012-08-31 MED ORDER — MELOXICAM 7.5 MG PO TABS: ORAL_TABLET | ORAL | Status: DC

## 2012-08-31 MED ORDER — AMOXICILLIN 250 MG PO CAPS
500.0000 mg | ORAL_CAPSULE | Freq: Once | ORAL | Status: AC
  Administered 2012-08-31: 500 mg via ORAL
  Filled 2012-08-31: qty 2

## 2012-08-31 MED ORDER — NEOMYCIN-POLYMYXIN-HC 3.5-10000-1 OT SOLN
3.0000 [drp] | Freq: Once | OTIC | Status: AC
  Administered 2012-08-31: 3 [drp] via OTIC
  Filled 2012-08-31: qty 10

## 2012-08-31 MED ORDER — HYDROCODONE-ACETAMINOPHEN 5-325 MG PO TABS
2.0000 | ORAL_TABLET | Freq: Once | ORAL | Status: AC
  Administered 2012-08-31: 2 via ORAL
  Filled 2012-08-31: qty 2

## 2012-08-31 NOTE — ED Notes (Signed)
Pain rt ear for 3 weeks and low back pain at same time.  No injury , alert,

## 2012-08-31 NOTE — ED Notes (Signed)
Outer ear has been tender for 3 weeks but yesterday and overnight became progressively worse to the point he could not lay on it.  Also c/o chronic lower back pain.

## 2012-08-31 NOTE — ED Provider Notes (Signed)
History    CSN: [id number] Arrival date & time 08/31/12  1204  First MD Initiated Contact with Patient 08/31/12 1329     Chief Complaint  Patient presents with  . Otalgia  . Back Pain   (Consider location/radiation/quality/duration/timing/severity/associated sxs/prior Treatment) Patient is a 52 y.o. male presenting with ear pain and back pain. The history is provided by the patient.  Otalgia Location:  Right Behind ear:  No abnormality Quality:  Aching Severity:  Moderate Onset quality:  Gradual Duration:  3 weeks Timing:  Intermittent Progression:  Worsening Chronicity:  New Context: not direct blow, not foreign body in ear and no water in ear   Relieved by:  Nothing Worsened by:  Nothing tried Ineffective treatments:  OTC medications Associated symptoms: no abdominal pain, no cough, no ear discharge, no fever, no neck pain, no rash and no sore throat   Back Pain Associated symptoms: no abdominal pain, no chest pain, no dysuria and no fever    Past Medical History  Diagnosis Date  . Deep vein thrombosis 2012    Right lower extremity venous ultrasound on Jun 25, 2010 showed DVT  involving the popliteal vein below the knee and paraposterior   tibial veins of calf.  Suspicion of chronic DVT as there was a    prominent collateral vein and partial recannulization of the  popliteal vein and posterior tibial veins  . Chronic lower back pain     and neck pain  . Chronic neck pain    Past Surgical History  Procedure Laterality Date  . Cervical fusion     Family History  Problem Relation Age of Onset  . Diabetes Other    History  Substance Use Topics  . Smoking status: Never Smoker   . Smokeless tobacco: Never Used  . Alcohol Use: 7.0 oz/week    14 drink(s) per week     Comment: beer one every 2 days    Review of Systems  Constitutional: Negative for fever and activity change.       All ROS Neg except as noted in HPI  HENT: Positive for ear pain. Negative for  nosebleeds, sore throat, neck pain and ear discharge.   Eyes: Negative for photophobia and discharge.  Respiratory: Negative for cough, shortness of breath and wheezing.   Cardiovascular: Negative for chest pain and palpitations.  Gastrointestinal: Negative for abdominal pain and blood in stool.  Genitourinary: Negative for dysuria, frequency and hematuria.  Musculoskeletal: Positive for back pain. Negative for arthralgias.  Skin: Negative.  Negative for rash.  Neurological: Negative for dizziness, seizures and speech difficulty.  Psychiatric/Behavioral: Negative for hallucinations and confusion.    Allergies  Review of patient's allergies indicates no known allergies.  Home Medications   Current Outpatient Rx  Name  Route  Sig  Dispense  Refill  . cyclobenzaprine (FLEXERIL) 10 MG tablet   Oral   Take 1 tablet (10 mg total) by mouth 2 (two) times daily as needed for muscle spasms.   20 tablet   0   . HYDROcodone-acetaminophen (NORCO/VICODIN) 5-325 MG per tablet   Oral   Take 1 tablet by mouth every 4 (four) hours as needed for pain.   20 tablet   0   . ibuprofen (ADVIL,MOTRIN) 200 MG tablet   Oral   Take 400 mg by mouth every 6 (six) hours as needed for pain.         . Menthol, Topical Analgesic, (ICY HOT ADVANCED RELIEF EX)   Apply  externally   Apply 1 application topically daily as needed (back pain).          BP 121/64  Pulse 52  Temp(Src) 97.9 F (36.6 C) (Oral)  Resp 17  Ht 5\' 9"  (1.753 m)  Wt 200 lb (90.719 kg)  BMI 29.52 kg/m2  SpO2 97% Physical Exam  Nursing note and vitals reviewed. Constitutional: He is oriented to person, place, and time. He appears well-developed and well-nourished.  Non-toxic appearance.  HENT:  Head: Normocephalic.  Right Ear: Tympanic membrane and external ear normal.  Left Ear: Tympanic membrane and external ear normal.  Pain of the tragus and the EAC. Mod swelling of the canal.  Eyes: EOM and lids are normal. Pupils are  equal, round, and reactive to light.  Neck: Normal range of motion. Neck supple. Carotid bruit is not present.  Cardiovascular: Normal rate, regular rhythm, normal heart sounds, intact distal pulses and normal pulses.   Pulmonary/Chest: Breath sounds normal. No respiratory distress.  Abdominal: Soft. Bowel sounds are normal. There is no tenderness. There is no guarding.  Musculoskeletal: Normal range of motion.  Upper and lower back pain with ROM  Lymphadenopathy:       Head (right side): No submandibular adenopathy present.       Head (left side): No submandibular adenopathy present.    He has no cervical adenopathy.  Neurological: He is alert and oriented to person, place, and time. He has normal strength. No cranial nerve deficit or sensory deficit.  Skin: Skin is warm and dry.  Psychiatric: He has a normal mood and affect. His speech is normal.    ED Course  Procedures (including critical care time) Labs Reviewed - No data to display No results found. No diagnosis found.  MDM  **I have reviewed nursing notes, vital signs, and all appropriate lab and imaging results for this patient.* Patient reports pain and tenderness over the left outer ear. This problem has been going on for nearly 3 weeks. He denies fever or chills. He has not seen drainage from the ear. He denies swimming. He is not sure if she may have scratched the inner aspect of the ear.  The examination reveals some swelling of the ex term auditory canal. There is pain of the tragus with even light touch. There is no mastoid area tenderness at this time and no redness.  The plan at this time is for the patient be treated with Cortisporin otic drops 3 times daily, Amoxil 3 times daily, Norco every 4 hours, and Mobic 2 times daily. Patient is to see his primary physician if not improving.  Kathie Dike, PA-C 08/31/12 1358

## 2012-09-04 NOTE — ED Provider Notes (Signed)
Medical screening examination/treatment/procedure(s) were performed by non-physician practitioner and as supervising physician I was immediately available for consultation/collaboration.  Donnetta Hutching, MD 09/04/12 [phone]

## 2012-11-23 ENCOUNTER — Encounter (HOSPITAL_COMMUNITY): Payer: Self-pay | Admitting: Emergency Medicine

## 2012-11-23 ENCOUNTER — Emergency Department (HOSPITAL_COMMUNITY)
Admission: EM | Admit: 2012-11-23 | Discharge: 2012-11-23 | Disposition: A | Discharge status: Home / Self Care | Payer: Medicare Other | Attending: Emergency Medicine | Admitting: Emergency Medicine

## 2012-11-23 DIAGNOSIS — Z86718 Personal history of other venous thrombosis and embolism: Secondary | ICD-10-CM | POA: Insufficient documentation

## 2012-11-23 DIAGNOSIS — M542 Cervicalgia: Secondary | ICD-10-CM | POA: Insufficient documentation

## 2012-11-23 DIAGNOSIS — G8929 Other chronic pain: Secondary | ICD-10-CM | POA: Insufficient documentation

## 2012-11-23 DIAGNOSIS — M545 Low back pain, unspecified: Secondary | ICD-10-CM | POA: Insufficient documentation

## 2012-11-23 DIAGNOSIS — M5380 Other specified dorsopathies, site unspecified: Secondary | ICD-10-CM | POA: Insufficient documentation

## 2012-11-23 MED ORDER — HYDROCODONE-ACETAMINOPHEN 5-325 MG PO TABS
1.0000 | ORAL_TABLET | Freq: Four times a day (QID) | ORAL | Status: DC | PRN
Start: 2012-11-23 — End: 2013-03-04

## 2012-11-23 MED ORDER — CYCLOBENZAPRINE HCL 5 MG PO TABS: 5.0000 mg | ORAL_TABLET | Freq: Three times a day (TID) | ORAL | Status: DC | PRN
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
## 2012-11-23 NOTE — ED Notes (Signed)
Pt reports chronic back  And neck pain, pain is worse for the last week, "occurs when the weather changes". Denies any new or recent injury.

## 2012-11-23 NOTE — ED Notes (Signed)
Patient with no complaints at this time. Respirations even and unlabored. Skin warm/dry. Discharge instructions reviewed with patient at this time. Patient given opportunity to voice concerns/ask questions. Patient discharged at this time and left Emergency Department with steady gait.   

## 2013-03-04 ENCOUNTER — Emergency Department (HOSPITAL_COMMUNITY)
Admission: EM | Admit: 2013-03-04 | Discharge: 2013-03-04 | Disposition: A | Discharge status: Home / Self Care | Payer: Medicare Other | Attending: Emergency Medicine | Admitting: Emergency Medicine

## 2013-03-04 ENCOUNTER — Encounter (HOSPITAL_COMMUNITY): Payer: Self-pay | Admitting: Emergency Medicine

## 2013-03-04 ENCOUNTER — Emergency Department (HOSPITAL_COMMUNITY): Payer: Medicare Other

## 2013-03-04 DIAGNOSIS — Y9389 Activity, other specified: Secondary | ICD-10-CM | POA: Insufficient documentation

## 2013-03-04 DIAGNOSIS — M545 Low back pain: Secondary | ICD-10-CM

## 2013-03-04 DIAGNOSIS — B9789 Other viral agents as the cause of diseases classified elsewhere: Secondary | ICD-10-CM | POA: Insufficient documentation

## 2013-03-04 DIAGNOSIS — G8929 Other chronic pain: Secondary | ICD-10-CM | POA: Insufficient documentation

## 2013-03-04 DIAGNOSIS — Y929 Unspecified place or not applicable: Secondary | ICD-10-CM | POA: Insufficient documentation

## 2013-03-04 DIAGNOSIS — M542 Cervicalgia: Secondary | ICD-10-CM | POA: Insufficient documentation

## 2013-03-04 DIAGNOSIS — Z86718 Personal history of other venous thrombosis and embolism: Secondary | ICD-10-CM | POA: Insufficient documentation

## 2013-03-04 DIAGNOSIS — J029 Acute pharyngitis, unspecified: Secondary | ICD-10-CM | POA: Insufficient documentation

## 2013-03-04 DIAGNOSIS — Z79899 Other long term (current) drug therapy: Secondary | ICD-10-CM | POA: Insufficient documentation

## 2013-03-04 DIAGNOSIS — X500XXA Overexertion from strenuous movement or load, initial encounter: Secondary | ICD-10-CM | POA: Insufficient documentation

## 2013-03-04 DIAGNOSIS — IMO0002 Reserved for concepts with insufficient information to code with codable children: Secondary | ICD-10-CM | POA: Insufficient documentation

## 2013-03-04 DIAGNOSIS — Z9889 Other specified postprocedural states: Secondary | ICD-10-CM | POA: Insufficient documentation

## 2013-03-04 DIAGNOSIS — J069 Acute upper respiratory infection, unspecified: Secondary | ICD-10-CM | POA: Insufficient documentation

## 2013-03-04 DIAGNOSIS — R52 Pain, unspecified: Secondary | ICD-10-CM | POA: Insufficient documentation

## 2013-03-04 DIAGNOSIS — B349 Viral infection, unspecified: Secondary | ICD-10-CM

## 2013-03-04 LAB — RAPID STREP SCREEN (MED CTR MEBANE ONLY): Streptococcus, Group A Screen (Direct): NEGATIVE

## 2013-03-04 MED ORDER — METHOCARBAMOL 500 MG PO TABS: 500.0000 mg | ORAL_TABLET | Freq: Two times a day (BID) | ORAL | Status: DC

## 2013-03-04 MED ORDER — PREDNISONE 20 MG PO TABS
ORAL_TABLET | ORAL | Status: DC
Start: 2013-03-04 — End: 2013-03-14

## 2013-03-04 NOTE — ED Notes (Signed)
1. Pt reports "cold" since Friday. 2.low back pain since Saturday. Injured his back putting wood in a fireplace.

## 2013-03-04 NOTE — Discharge Instructions (Signed)
Please call your doctor for a followup appointment within 24-48 hours. When you talk to your doctor please let them know that you were seen in the emergency department and have them acquire all of your records so that they can discuss the findings with you and formulate a treatment plan to fully care for your new and ongoing problems. Please rest and stay hydrated This is most likely a viral infection-the only way to treat this with rest and hydration Please take medications as prescribed his prednisone and Robaxin for back pain Please speak to your primary care provider regarding this ongoing back pain-pain management will be best for you Please continue to monitor symptoms closely and if symptoms are to worsen or change (fever greater than 101, chills, neck pain, chest pain, shortness of breath, difficulty breathing, fall, injury, numbness, loss of sensation to the legs, inability to walk, worsening pain or changes to pain pattern, neck stiffness, inability to control urine or bowel movements) please report back to emergency department immediately  Upper Respiratory Infection, Adult An upper respiratory infection (URI) is also known as the common cold. It is often caused by a type of germ (virus). Colds are easily spread (contagious). You can pass it to others by kissing, coughing, sneezing, or drinking out of the same glass. Usually, you get better in 1 or 2 weeks.  HOME CARE   Only take medicine as told by your doctor.  Use a warm mist humidifier or breathe in steam from a hot shower.  Drink enough water and fluids to keep your pee (urine) clear or pale yellow.  Get plenty of rest.  Return to work when your temperature is back to normal or as told by your doctor. You may use a face mask and wash your hands to stop your cold from spreading. GET HELP RIGHT AWAY IF:   After the first few days, you feel you are getting worse.  You have questions about your medicine.  You have chills,  shortness of breath, or brown or red spit (mucus).  You have yellow or brown snot (nasal discharge) or pain in the face, especially when you bend forward.  You have a fever, puffy (swollen) neck, pain when you swallow, or white spots in the back of your throat.  You have a bad headache, ear pain, sinus pain, or chest pain.  You have a high-pitched whistling sound when you breathe in and out (wheezing).  You have a lasting cough or cough up blood.  You have sore muscles or a stiff neck. MAKE SURE YOU:   Understand these instructions.  Will watch your condition.  Will get help right away if you are not doing well or get worse. Document Released: 07/20/2007 Document Revised: 04/25/2011 Document Reviewed: 06/07/2010 [facility] Patient Information 2014 [place], [place].  Chronic Back Pain  When back pain lasts longer than 3 months, it is called chronic back pain.People with chronic back pain often go through certain periods that are more intense (flare-ups).  CAUSES Chronic back pain can be caused by wear and tear (degeneration) on different structures in your back. These structures include:  The bones of your spine (vertebrae) and the joints surrounding your spinal cord and nerve roots (facets).  The strong, fibrous tissues that connect your vertebrae (ligaments). Degeneration of these structures may result in pressure on your nerves. This can lead to constant pain. HOME CARE INSTRUCTIONS  Avoid bending, heavy lifting, prolonged sitting, and activities which make the problem worse.  Take brief  periods of rest throughout the day to reduce your pain. Lying down or standing usually is better than sitting while you are resting.  Take over-the-counter or prescription medicines only as directed by your caregiver. SEEK IMMEDIATE MEDICAL CARE IF:   You have weakness or numbness in one of your legs or feet.  You have trouble controlling your bladder or bowels.  You have nausea,  vomiting, abdominal pain, shortness of breath, or fainting. Document Released: 03/10/2004 Document Revised: 04/25/2011 Document Reviewed: 01/15/2011 [facility] Patient Information 2014 [place], [place].   Emergency Department Resource Guide 1) Find a Doctor and Pay Out of Pocket Although you won't have to find out who is covered by your insurance plan, it is a good idea to ask around and get recommendations. You will then need to call the office and see if the doctor you have chosen will accept you as a new patient and what types of options they offer for patients who are self-pay. Some doctors offer discounts or will set up payment plans for their patients who do not have insurance, but you will need to ask so you aren't surprised when you get to your appointment.  2) Contact Your Local Health Department Not all health departments have doctors that can see patients for sick visits, but many do, so it is worth a call to see if yours does. If you don't know where your local health department is, you can check in your phone book. The CDC also has a tool to help you locate your state's health department, and many state websites also have listings of all of their local health departments.  3) Find a Walk-in Clinic If your illness is not likely to be very severe or complicated, you may want to try a walk in clinic. These are popping up all over the country in pharmacies, drugstores, and shopping centers. They're usually staffed by nurse practitioners or physician assistants that have been trained to treat common illnesses and complaints. They're usually fairly quick and inexpensive. However, if you have serious medical issues or chronic medical problems, these are probably not your best option.  No Primary Care Doctor: - Call Health Connect at  [phone] - they can help you locate a primary care doctor that  accepts your insurance, provides certain services, etc. - Physician Referral Service-  [phone]  Chronic Pain Problems: Organization         Address  Phone   Notes  [facility]  [phone] Patients need to be referred by their primary care doctor.   Medication Assistance: Organization         Address  Phone   Notes  [facility] [address] [place], [place] [address] [phone] --Must be a resident of [facility] [place] -- Must have NO insurance coverage whatsoever (no Medicaid/ Medicare, etc.) -- The pt. MUST have a primary care doctor that directs their care regularly and follows them in the community   [facility]  [phone]   [employer]  [phone]    Agencies that provide inexpensive medical care: Organization         Address  Phone   Notes  [facility]  [phone]   [facility]    [phone]   [facility] [address] [place], [place] [address] [phone]   [facility] [address], [place] [phone]   [facility]    (  [phone]) [phone]   °[facility]    ([phone]) [phone]   °[facility] ° [address], [place] Phone:  ([phone]) [phone], Fax:  ([phone] Hours of Operation:  9 am - 6 pm, M-F.  Also accepts Medicaid/Medicare and self-pay.  °[facility] ° [address], [place] Phone: ([phone]) [phone], Fax: [phone]. Hours of Operation:  8:30 am - 5:30 pm, M-F.  Also accepts Medicaid and self-pay.  °[facility] [place] [address], [place] Phone: ([phone]) [phone]   °[facility] [address], [place], [place] (336)[phone], Ext. 123 Mondays & Thursdays: 7-9 AM.  First 15 patients are seen on a first come, first serve basis. °  ° °Medicaid-accepting [place] Providers: ° °Organization         Address  Phone   Notes  °[facility] [address],  [place] ([phone]) [phone] Also accepts self-pay patients.  °[facility] [address], [place] ° ([phone]) [phone]   °[facility] [address], [place] ([phone]) [phone]   °[facility] [address] [place] [address], [place] ([phone]) [phone]   °Veita Bland [address], [place]  ° ([phone]) [phone] Only accepts [place] Access Medicaid patients after they have their name applied to their card.  ° °Self-Pay (no insurance) in [place]: ° °Organization         Address  Phone   Notes  °Sickle Cell Patients, [facility] [address], [place] ([phone]) [phone]   °[facility] [address], [place] ([phone]) [phone]   °[facility] ° [address], [place] ([phone]) [phone]   °[facility]/Dr. Osei-Bonsu ° [address] [place] [address], [place] or [address], [place] ([phone]) [phone] Phone number for both [place] and [place] locations is the same.  °[facility] [address], [place] ([phone]) [phone]   °[facility] [address] [place] [address], [place] or [address] (336) [phone] °(336) [phone]   °[facility] [address], [place] (336) [phone], phone; ([phone]) [phone], fax Sees patients 1st and 3rd Saturday of every month.  Must not qualify for public or private insurance (i.e. Medicaid, Medicare, [place] Health Choice, Veterans' Benefits) • Household income should be no more than 200% of the poverty level •The clinic cannot treat you if you are pregnant or think you are pregnant • Sexually transmitted diseases are not treated at the clinic.  ° ° °Dental Care: °Organization         Address  Phone  Notes  °[place] [facility] [address],  (336) [phone] Accepts children up to age 21 who are enrolled in  Medicaid or [place] Health Choice; pregnant women with a Medicaid card; and children who have applied for Medicaid or [place] Health Choice, but were declined, whose parents can pay a reduced fee at time of service.  °[facility] [place]  [address], [place] (336) 641-7733 Accepts children up to age 21 who are enrolled in Medicaid or [place] Health Choice; pregnant women with a Medicaid card; and children who have applied for Medicaid or [place] Health Choice, but were declined, whose parents can pay a   reduced fee at time of service.  °[facility] ° [address], [place] ([phone]) [phone] Patients are seen by appointment only. Walk-ins are not accepted. [facility] will see patients 18 years of age and older. °Monday - Tuesday (8am-5pm) °Most Wednesdays (8:30-5pm) °$30 per visit, cash only  °[facility] ° [address], [place] ([phone]) [phone] Patients are seen by appointment only. Walk-ins are not accepted. [facility] will see patients 18 years of age and older. °One Wednesday Evening (Monthly: Volunteer Based).  $30 per visit, cash only  °[facility]  ([phone]) [phone] for adults; Children under age 4, call Graduate Pediatric Dentistry at [phone]. Children aged 4-14, please call [phone]) [phone] to request a pediatric application. ° Dental services are provided in all areas of dental care including fillings, crowns and bridges, complete and partial dentures, implants, gum treatment, root canals, and extractions. Preventive care is also provided. Treatment is provided to both adults and children. °Patients are selected via a lottery and there is often a waiting list. °  °[facility] [address], °[place] ° (336) 763-8833 [email or web address] °  °[facility] [address], [place], [place] (336)723-1848, Ext. 123 Second and Fourth Thursday of each month, opens at 6:30  AM; Clinic ends at 9 AM.  Patients are seen on a first-come first-served basis, and a limited number are seen during each clinic.  ° °[facility] ° [address], [place], [place] ([phone]) [phone]   Eligibility Requirements °You must have lived in [place], [place], or [place] counties for at least the last three months. °  You cannot be eligible for state or federal sponsored healthcare insurance, including Veterans Administration, Medicaid, or Medicare. °  You generally cannot be eligible for healthcare insurance through your employer.  °  How to apply: °Eligibility screenings are held every Tuesday and Wednesday afternoon from 1:00 pm until 4:00 pm. You do not need an appointment for the interview!  °[facility] [address], [place], [place] [phone]   °[facility]  336-342-8273   °[facility]  336-703-3100   °[facility]  336-570-6415   ° °Behavioral Health Resources in the Community: °Intensive Outpatient Programs °Organization         Address  Phone  Notes  °[facility] [address], [place], [place] 336-878-6098   °[facility] [address], [place], [place] [phone]   °[facility] [address], [place], [place] ° 336-882-2125   °[facility] [address],  °[place],  1-800-853-5163 or 336-641-4981   °Substance Abuse Resources °Organization         Address  Phone  Notes  °[facility]  336-882-2125   °[facility]  336-784-9470   °[facility]  336-285-9073   °[facility]  336-845-3988   °Residential & Outpatient Substance Abuse Program  1-800-659-3381   °Psychological Services °Organization         Address  Phone  Notes  °[facility]  336- 832-9600   °[facility]  336- 378-7881   °[facility] [address], [place] 1-800-853-5163 or  336-641-4981   ° °Mobile Crisis Teams °Organization         Address  Phone  Notes  °[facility], Mobile Crisis Care Unit  1-877-626-1772   °  [facility]  [address] [place], [place]   [facility] [address], [place] [phone]    Self-Help/Support Groups Organization         Address  Phone             Notes  [facility] [place] [facility] - variety of support groups  [place] Call for more information  Narcotics Anonymous (NA), [facility] [address], [employer] [place]  2 meetings at this location   Special educational needs teacher         Address  Phone  Notes  [facility] [place],    [place]  1-(614)612-1161   [facility]  [address] [place] [address], [place], [place]   [place], [place] [phone] Admissions: 8am-3pm M-F  Incentives Substance [place] [address],    [place], [place] [phone]   [facility] [address] [place], [place], [place]   [facility] [address]  [place], [place]   [facility] - Intensive Outpatient [address], [place], [place]   [facility] ([place]  [place], [place] 1-901 169 6496 or 701-791-1810   [facility] ([facility] [address], [place], [place] Accepts Medicaid  [facility] [place] [address]  [place] 1-904 279 3037 Substance Abuse/Addiction Treatment   [facility] Organization         Address  Phone  Notes  [facility]  917-054-9768   Domenic Schwab, PhD [address] [place] [place], [place]   (310)[phone] or 404-724-7332   [facility], [place] [phone]   [facility] [address]  [place], [place] (831)[phone] Insurance/Medicaid/sponsorship through [facility] and Families [address] [place]                                    [address], [place] [phone] [place] [address], [place] [phone]    Dr. Adele Schilder  [phone]   [facility] Dept. 1) [address], [place] 2) [place] 3)  [address], [place] [phone]  [phone]   [place] [phone] or [phone] (After Hours)

## 2013-03-04 NOTE — ED Notes (Signed)
Patient transported to X-ray 

## 2013-03-04 NOTE — ED Provider Notes (Signed)
CSN: [id number]     Arrival date & time 03/04/13  0920 History   First MD Initiated Contact with Patient 03/04/13 [phone]     Chief Complaint  Patient presents with  . Back Pain  . URI   (Consider location/radiation/quality/duration/timing/severity/associated sxs/prior Treatment) The history is provided by the patient. No language interpreter was used.  Danny Fox is a 53 year old male with past medical history of DVT, chronic back pain, chronic neck pain, anterior lumbar fusion performed in 2013 and cervical fusion presenting to emergency department with upper respiratory infection symptoms. Patient reported on Friday he started to develop a dry cough, sore throat, nasal congestion. Patient reported he's been feeling feverish for the past couple of days. Reported he's been using DayQuil, NyQuil, orange juice and cough drops with minimal relief. Patient reported that his mother has the same symptoms, reported his mother isstaying with him. Patient also reported he's been experiencing lower back pain described as a constant aching sensation that exacerbated this weekend when patient was picking up wood to put in the stove. Patient reported that the pain worsens with motion and walking and coughing. Patient reported that he has been having back pain for the past 10 years. Denied chest pain, shortness of breath, difficulty breathing, nausea, vomiting, abdominal pain, and numbness, tingling, urinary incontinence, bowel incontinence, neck stiffness, falls, recent injuries. PCP Dr. Verdon Cummins  Past Medical History  Diagnosis Date  . Deep vein thrombosis 2012    Right lower extremity venous ultrasound on Jun 25, 2010 showed DVT  involving the popliteal vein below the knee and paraposterior   tibial veins of calf.  Suspicion of chronic DVT as there was a    prominent collateral vein and partial recannulization of the  popliteal vein and posterior tibial veins  . Chronic lower back pain     and neck pain   . Chronic neck pain    Past Surgical History  Procedure Laterality Date  . Cervical fusion     Family History  Problem Relation Age of Onset  . Diabetes Other    History  Substance Use Topics  . Smoking status: Never Smoker   . Smokeless tobacco: Never Used  . Alcohol Use: 7.0 oz/week    14 drink(s) per week     Comment: beer one every 2 days    Review of Systems  Constitutional: Positive for fever (subjective). Negative for chills.  HENT: Positive for congestion and sore throat. Negative for trouble swallowing.   Respiratory: Positive for cough. Negative for chest tightness and shortness of breath.   Cardiovascular: Negative for chest pain.  Gastrointestinal: Negative for nausea, vomiting, abdominal pain and diarrhea.  Genitourinary: Negative for urgency.  Musculoskeletal: Positive for back pain (chronic) and neck pain (chronic).  Neurological: Positive for headaches. Negative for dizziness and weakness.  All other systems reviewed and are negative.    Allergies  Review of patient's allergies indicates no known allergies.  Home Medications   Current Outpatient Rx  Name  Route  Sig  Dispense  Refill  . cyclobenzaprine (FLEXERIL) 5 MG tablet   Oral   Take 1 tablet (5 mg total) by mouth 3 (three) times daily as needed for muscle spasms.   21 tablet   0   . ibuprofen (ADVIL,MOTRIN) 200 MG tablet   Oral   Take 400-600 mg by mouth 3 (three) times daily as needed for mild pain.          . methocarbamol (ROBAXIN) 500 MG tablet  Oral   Take 1 tablet (500 mg total) by mouth 2 (two) times daily.   20 tablet   0   . predniSONE (DELTASONE) 20 MG tablet      3 tabs po day one, then 2 tabs daily x 4 days   11 tablet   0    BP 105/75  Pulse 64  Temp(Src) 97.9 F (36.6 C) (Oral)  Resp 22  Ht 5\' 9"  (1.753 m)  Wt 204 lb (92.534 kg)  BMI 30.11 kg/m2  SpO2 100% Physical Exam  Nursing note and vitals reviewed. Constitutional: He is oriented to person, place,  and time. He appears well-developed and well-nourished. No distress.  HENT:  Head: Normocephalic and atraumatic.  Eyes: Conjunctivae and EOM are normal. Pupils are equal, round, and reactive to light. Right eye exhibits no discharge. Left eye exhibits no discharge.  Neck: Normal range of motion. Neck supple. No tracheal deviation present.  Discomfort upon palpation to C-spine-chronic neck pain Negative nuchal rigidity Negative cervical lymphadenopathy Negative meningeal signs  Cardiovascular: Normal rate, regular rhythm and normal heart sounds.  Exam reveals no friction rub.   No murmur heard. Pulses:      Radial pulses are 2+ on the right side, and 2+ on the left side.       Dorsalis pedis pulses are 2+ on the right side, and 2+ on the left side.  Pulmonary/Chest: Effort normal and breath sounds normal. No respiratory distress. He has no wheezes. He has no rales. He exhibits no tenderness.  Musculoskeletal: Normal range of motion.       Back:  Negative swelling, erythema, inflammation, lesions, sores, bulging, deformities, ecchymosis noted to the cervical/thoracic/lumbosacral spine/coccyx region of the spine. Discomfort upon palpation to the lumbosacral/coccyx mid spine and paraspinal regions bilaterally. Full range of motion to lower extremities bilaterally without difficulty or ataxia. Patient is able to stand up, patient is able to go from a sitting to standing position without difficulty.  Lymphadenopathy:    He has no cervical adenopathy.  Neurological: He is alert and oriented to person, place, and time. No cranial nerve deficit. He exhibits normal muscle tone. Coordination normal.  Cranial nerves III-XII grossly intact Strength 5+/5+ to upper and lower extremities bilaterally with resistance applied, equal distribution noted Heel to knee down shin normal bilaterally Gait proper, proper balance - negative sway, negative drift, negative step-offs.  Skin: Skin is warm and dry. No rash  noted. He is not diaphoretic. No erythema.  Psychiatric: He has a normal mood and affect. His behavior is normal. Thought content normal.    ED Course  Procedures (including critical care time)  Results for orders placed during the hospital encounter of 03/04/13  RAPID STREP SCREEN      Result Value Range   Streptococcus, Group A Screen (Direct) NEGATIVE  NEGATIVE   Dg Chest 2 View  03/04/2013   CLINICAL DATA:  Back pain.  EXAM: CHEST  2 VIEW  COMPARISON:  None.  FINDINGS: Mediastinum and hilar structures are normal. The lungs are clear. Heart size is normal. Normal pulmonary vascularity. No pleural effusion or pneumothorax. Prior cervical spine fusion. Degenerative changes thoracic spine.  IMPRESSION: No active cardiopulmonary disease.   Electronically Signed   By: Maisie Fus  Register   On: 03/04/2013 11:24   Labs Review Labs Reviewed  RAPID STREP SCREEN  CULTURE, GROUP A STREP   Imaging Review Dg Chest 2 View  03/04/2013   CLINICAL DATA:  Back pain.  EXAM: CHEST  2  VIEW  COMPARISON:  None.  FINDINGS: Mediastinum and hilar structures are normal. The lungs are clear. Heart size is normal. Normal pulmonary vascularity. No pleural effusion or pneumothorax. Prior cervical spine fusion. Degenerative changes thoracic spine.  IMPRESSION: No active cardiopulmonary disease.   Electronically Signed   By: Maisie Fushomas  Register   On: 03/04/2013 11:24    EKG Interpretation   None       MDM   1. URI (upper respiratory infection)   2. Viral illness   3. Acute exacerbation of chronic low back pain     Medications - No data to display  Filed Vitals:   03/04/13 0930  BP: 105/75  Pulse: 64  Temp: 97.9 F (36.6 C)  TempSrc: Oral  Resp: 22  Height: 5\' 9"  (1.753 m)  Weight: 204 lb (92.534 kg)  SpO2: 100%    Patient presenting to emergency department with dry cough, sore throat, nasal congestion that has been ongoing since Friday. Reported that mother has same symptoms. Patient reports low  back pain that started this weekend after picking up wood. Patient has history of chronic low back pain for the past 10 years with anterior lumbar fusion performed in 2013. Reported that he's been using over-the-counter medication muscle axis and minimal relief Alert and oriented. GCS 15. Heart rate and rhythm normal. Lungs clear to auscultation to upper and lower lobes. Nasal congestion identified-minimal nasal patency secondary to congestion. Discomfort upon palpation to the posterior aspect of the neck-patient has history of chronic neck pain. Negative neck stiffness, negative nuchal rigidity, negative meningeal signs. Pulses palpable and strong-radial and DP 2+. Negative deformities noted to the lumbosacral spine. Discomfort upon palpation to lumbosacral/coccyx region-mid spinal and paraspinal bilaterally. Full range of motion to lower extremities without difficulty. Patient is able to stand up and walk without difficulty-negative step-offs. Strength intact with resistance applied, equal distribution noted. Sensation intact. Doubt cauda equina. Doubt epidural abscess. Patient presenting with acute exacerbation of chronic back pain. Negative findings for pneumonia. Negative rapid strep-doubt streptococcal pharyngitis. Suspicion to be upper respiratory infection, viral. Patient stable, afebrile. Discussed with patient to rest and stay hydrated-discussed supportive therapy. Referred patient to primary care provider to be reassessed. Discussed with patient that his back pain is a chronic issue to continue to use muscle relaxers as needed. Discussed with patient to rest and avoid any physical or strenuous activity. Discussed with patient to closely monitor symptoms and if symptoms are to worsen or change to report back to the ED - strict return instructions given.  Patient agreed to plan of care, understood, all questions answered.      Raymon MuttonMarissa Chadwick Reiswig, PA-C 03/04/13 1928

## 2013-03-05 NOTE — ED Provider Notes (Signed)
Medical screening examination/treatment/procedure(s) were performed by non-physician practitioner and as supervising physician I was immediately available for consultation/collaboration.  EKG Interpretation   None         Noya Santarelli N Abdo Denault, DO 03/05/13 1940 

## 2013-03-06 LAB — CULTURE, GROUP A STREP

## 2013-03-14 ENCOUNTER — Emergency Department (HOSPITAL_COMMUNITY): Payer: Medicare Other

## 2013-03-14 ENCOUNTER — Encounter (HOSPITAL_COMMUNITY): Payer: Self-pay | Admitting: Emergency Medicine

## 2013-03-14 ENCOUNTER — Emergency Department (HOSPITAL_COMMUNITY)
Admission: EM | Admit: 2013-03-14 | Discharge: 2013-03-14 | Disposition: A | Discharge status: Home / Self Care | Payer: Medicare Other | Attending: Emergency Medicine | Admitting: Emergency Medicine

## 2013-03-14 DIAGNOSIS — Z79899 Other long term (current) drug therapy: Secondary | ICD-10-CM | POA: Insufficient documentation

## 2013-03-14 DIAGNOSIS — Z86718 Personal history of other venous thrombosis and embolism: Secondary | ICD-10-CM | POA: Insufficient documentation

## 2013-03-14 DIAGNOSIS — M722 Plantar fascial fibromatosis: Secondary | ICD-10-CM

## 2013-03-14 DIAGNOSIS — G8929 Other chronic pain: Secondary | ICD-10-CM | POA: Insufficient documentation

## 2013-03-14 MED ORDER — PREDNISONE 50 MG PO TABS: 50.0000 mg | ORAL_TABLET | Freq: Every day | ORAL | Status: DC

## 2013-03-14 MED ORDER — HYDROCODONE-ACETAMINOPHEN 5-325 MG PO TABS: 1.0000 | ORAL_TABLET | Freq: Four times a day (QID) | ORAL | Status: DC | PRN

## 2013-03-14 NOTE — ED Notes (Signed)
C/o rt foot pain since Tuesday. Denies any injury.

## 2013-03-14 NOTE — Discharge Instructions (Signed)
Return here as needed.  Followup with the orthopedist provided stretch 5 minutes in the morning before getting out of bed. use ice on your foot

## 2013-03-14 NOTE — ED Provider Notes (Signed)
CSN: [id number]     Arrival date & time 03/14/13  1014 History   First MD Initiated Contact with Patient 03/14/13 1036     Chief Complaint  Patient presents with  . Foot Pain   (Consider location/radiation/quality/duration/timing/severity/associated sxs/prior Treatment) HPI Patient presents emergency department with right foot pain.  The patient, states, that started bothering him on Tuesday.  Patient, states, that he walks and stands a lot.  The patient denies calf pain, chest pain, shortness of breath, nausea, vomiting, fever, or diarrhea.  The patient, states, that nothing seems to make his condition, better, but palpation and walking make his pain, worse.  Patient, states he took ibuprofen prior to arrival with minimal relief of the symptoms Past Medical History  Diagnosis Date  . Deep vein thrombosis 2012    Right lower extremity venous ultrasound on Jun 25, 2010 showed DVT  involving the popliteal vein below the knee and paraposterior   tibial veins of calf.  Suspicion of chronic DVT as there was a    prominent collateral vein and partial recannulization of the  popliteal vein and posterior tibial veins  . Chronic lower back pain     and neck pain  . Chronic neck pain    Past Surgical History  Procedure Laterality Date  . Cervical fusion     Family History  Problem Relation Age of Onset  . Diabetes Other    History  Substance Use Topics  . Smoking status: Never Smoker   . Smokeless tobacco: Never Used  . Alcohol Use: 7.0 oz/week    14 drink(s) per week     Comment: beer one every 2 days    Review of Systems All other systems negative except as documented in the HPI. All pertinent positives and negatives as reviewed in the HPI. Allergies  Review of patient's allergies indicates no known allergies.  Home Medications   Current Outpatient Rx  Name  Route  Sig  Dispense  Refill  . ibuprofen (ADVIL,MOTRIN) 200 MG tablet   Oral   Take 400-600 mg by mouth 3 (three) times  daily as needed for mild pain.          . methocarbamol (ROBAXIN) 500 MG tablet   Oral   Take 1 tablet (500 mg total) by mouth 2 (two) times daily.   20 tablet   0    BP 136/79  Pulse 68  Temp(Src) 97.8 F (36.6 C)  Resp 16  SpO2 98% Physical Exam  Nursing note and vitals reviewed. Constitutional: He appears well-developed and well-nourished. No distress.  HENT:  Head: Normocephalic and atraumatic.  Pulmonary/Chest: Effort normal.  Abdominal: Soft.  Musculoskeletal:       Right foot: He exhibits decreased range of motion and tenderness. He exhibits no bony tenderness, no swelling, no crepitus and no deformity.       Feet:    ED Course  Procedures (including critical care time) Patient has what appears to be plantar fascitis based on his physical exam findings and history of present illness.  The patient is advised to use ice, on his foot.  He also, advised stretching exercises first thing in the morning.  Patient is advised to follow up with orthopedics.  Told to return here as needed   Carlyle DollyChristopher W Lyal Husted, PA-C 03/14/13 1048

## 2013-03-15 NOTE — ED Provider Notes (Signed)
Medical screening examination/treatment/procedure(s) were performed by non-physician practitioner and as supervising physician I was immediately available for consultation/collaboration.  EKG Interpretation   None         Shelda JakesScott W. Laurine Kuyper, MD 03/15/13 [phone]

## 2014-12-08 ENCOUNTER — Other Ambulatory Visit: Payer: Self-pay

## 2014-12-08 ENCOUNTER — Emergency Department (HOSPITAL_COMMUNITY): Payer: Medicare HMO

## 2014-12-08 ENCOUNTER — Encounter (HOSPITAL_COMMUNITY): Payer: Self-pay | Admitting: Emergency Medicine

## 2014-12-08 ENCOUNTER — Observation Stay (HOSPITAL_COMMUNITY)
Admission: EM | Admit: 2014-12-08 | Discharge: 2014-12-10 | Disposition: A | Discharge status: Home / Self Care | Payer: Medicare HMO | Attending: Family Medicine | Admitting: Family Medicine

## 2014-12-08 DIAGNOSIS — G8929 Other chronic pain: Secondary | ICD-10-CM

## 2014-12-08 DIAGNOSIS — F17210 Nicotine dependence, cigarettes, uncomplicated: Secondary | ICD-10-CM | POA: Diagnosis not present

## 2014-12-08 DIAGNOSIS — N182 Chronic kidney disease, stage 2 (mild): Secondary | ICD-10-CM | POA: Diagnosis not present

## 2014-12-08 DIAGNOSIS — Z91199 Patient's noncompliance with other medical treatment and regimen due to unspecified reason: Secondary | ICD-10-CM

## 2014-12-08 DIAGNOSIS — M7989 Other specified soft tissue disorders: Secondary | ICD-10-CM | POA: Diagnosis not present

## 2014-12-08 DIAGNOSIS — I82402 Acute embolism and thrombosis of unspecified deep veins of left lower extremity: Secondary | ICD-10-CM

## 2014-12-08 DIAGNOSIS — M542 Cervicalgia: Secondary | ICD-10-CM | POA: Diagnosis not present

## 2014-12-08 DIAGNOSIS — R0602 Shortness of breath: Secondary | ICD-10-CM | POA: Diagnosis present

## 2014-12-08 DIAGNOSIS — Z86718 Personal history of other venous thrombosis and embolism: Secondary | ICD-10-CM | POA: Insufficient documentation

## 2014-12-08 DIAGNOSIS — I2699 Other pulmonary embolism without acute cor pulmonale: Secondary | ICD-10-CM | POA: Diagnosis not present

## 2014-12-08 DIAGNOSIS — I82409 Acute embolism and thrombosis of unspecified deep veins of unspecified lower extremity: Secondary | ICD-10-CM | POA: Diagnosis not present

## 2014-12-08 DIAGNOSIS — M545 Low back pain: Secondary | ICD-10-CM | POA: Insufficient documentation

## 2014-12-08 DIAGNOSIS — Z7982 Long term (current) use of aspirin: Secondary | ICD-10-CM | POA: Diagnosis not present

## 2014-12-08 DIAGNOSIS — R0600 Dyspnea, unspecified: Secondary | ICD-10-CM

## 2014-12-08 DIAGNOSIS — Z9114 Patient's other noncompliance with medication regimen: Secondary | ICD-10-CM | POA: Diagnosis not present

## 2014-12-08 DIAGNOSIS — I82401 Acute embolism and thrombosis of unspecified deep veins of right lower extremity: Secondary | ICD-10-CM

## 2014-12-08 DIAGNOSIS — Z9119 Patient's noncompliance with other medical treatment and regimen: Secondary | ICD-10-CM

## 2014-12-08 LAB — CBC
HEMATOCRIT: 40.9 % (ref 39.0–52.0)
Hemoglobin: 13.7 g/dL (ref 13.0–17.0)
MCH: 31.5 pg (ref 26.0–34.0)
MCHC: 33.5 g/dL (ref 30.0–36.0)
MCV: 94 fL (ref 78.0–100.0)
PLATELETS: 226 10*3/uL (ref 150–400)
RBC: 4.35 MIL/uL (ref 4.22–5.81)
RDW: 13.1 % (ref 11.5–15.5)
WBC: 6.1 10*3/uL (ref 4.0–10.5)

## 2014-12-08 LAB — BASIC METABOLIC PANEL
ANION GAP: 7 (ref 5–15)
BUN: 13 mg/dL (ref 6–20)
CALCIUM: 9.6 mg/dL (ref 8.9–10.3)
CO2: 27 mmol/L (ref 22–32)
Chloride: 106 mmol/L (ref 101–111)
Creatinine, Ser: 1.35 mg/dL — ABNORMAL HIGH (ref 0.61–1.24)
GFR, EST NON AFRICAN AMERICAN: 58 mL/min — AB (ref >60)
Glucose, Bld: 103 mg/dL — ABNORMAL HIGH (ref 65–99)
POTASSIUM: 4.2 mmol/L (ref 3.5–5.1)
Sodium: 140 mmol/L (ref 135–145)

## 2014-12-08 LAB — TROPONIN I
TROPONIN I: 0.06 ng/mL — AB (ref <0.031)
TROPONIN I: 0.05 ng/mL — AB (ref <0.031)

## 2014-12-08 LAB — MRSA PCR SCREENING: MRSA by PCR: NEGATIVE

## 2014-12-08 LAB — BRAIN NATRIURETIC PEPTIDE: B NATRIURETIC PEPTIDE 5: 43 pg/mL (ref 0.0–100.0)

## 2014-12-08 MED ORDER — APIXABAN 5 MG PO TABS
10.0000 mg | ORAL_TABLET | Freq: Two times a day (BID) | ORAL | Status: DC
Start: 2014-12-08 — End: 2014-12-10
  Administered 2014-12-08 – 2014-12-10 (×5): 10 mg via ORAL
  Filled 2014-12-08 (×5): qty 2

## 2014-12-08 MED ORDER — ACETAMINOPHEN 325 MG PO TABS: 650.0000 mg | ORAL_TABLET | Freq: Four times a day (QID) | ORAL | Status: DC | PRN

## 2014-12-08 MED ORDER — SENNOSIDES-DOCUSATE SODIUM 8.6-50 MG PO TABS: 1.0000 | ORAL_TABLET | Freq: Every evening | ORAL | Status: DC | PRN

## 2014-12-08 MED ORDER — SODIUM CHLORIDE 0.9 % IV SOLN
INTRAVENOUS | Status: DC
  Administered 2014-12-08 – 2014-12-10 (×4): via INTRAVENOUS

## 2014-12-08 MED ORDER — HEPARIN (PORCINE) IN NACL 100-0.45 UNIT/ML-% IJ SOLN
1450.0000 [IU]/h | INTRAMUSCULAR | Status: DC
  Administered 2014-12-08: 1450 [IU]/h via INTRAVENOUS
  Filled 2014-12-08: qty 250

## 2014-12-08 MED ORDER — SODIUM CHLORIDE 0.9 % IV SOLN
INTRAVENOUS | Status: AC
  Administered 2014-12-08: 16:00:00 via INTRAVENOUS

## 2014-12-08 MED ORDER — ONDANSETRON HCL 4 MG/2ML IJ SOLN: 4.0000 mg | Freq: Four times a day (QID) | INTRAMUSCULAR | Status: DC | PRN

## 2014-12-08 MED ORDER — SODIUM CHLORIDE 0.9 % IV BOLUS (SEPSIS)
1000.0000 mL | Freq: Once | INTRAVENOUS | Status: AC
  Administered 2014-12-08: 1000 mL via INTRAVENOUS

## 2014-12-08 MED ORDER — ACETAMINOPHEN 650 MG RE SUPP: 650.0000 mg | Freq: Four times a day (QID) | RECTAL | Status: DC | PRN

## 2014-12-08 MED ORDER — ONDANSETRON HCL 4 MG PO TABS: 4.0000 mg | ORAL_TABLET | Freq: Four times a day (QID) | ORAL | Status: DC | PRN

## 2014-12-08 MED ORDER — MORPHINE SULFATE (PF) 2 MG/ML IV SOLN
1.0000 mg | Freq: Once | INTRAVENOUS | Status: AC
  Administered 2014-12-08: 1 mg via INTRAVENOUS
  Filled 2014-12-08: qty 1

## 2014-12-08 MED ORDER — ENOXAPARIN SODIUM 100 MG/ML ~~LOC~~ SOLN: 1.0000 mg/kg | Freq: Once | SUBCUTANEOUS | Status: DC

## 2014-12-08 MED ORDER — IOHEXOL 350 MG/ML SOLN
100.0000 mL | Freq: Once | INTRAVENOUS | Status: AC | PRN
  Administered 2014-12-08: 100 mL via INTRAVENOUS

## 2014-12-08 MED ORDER — HYDROCODONE-ACETAMINOPHEN 5-325 MG PO TABS
1.0000 | ORAL_TABLET | Freq: Four times a day (QID) | ORAL | Status: DC | PRN
  Administered 2014-12-08 – 2014-12-10 (×5): 1 via ORAL
  Filled 2014-12-08 (×5): qty 1

## 2014-12-08 MED ORDER — APIXABAN 5 MG PO TABS: 5.0000 mg | ORAL_TABLET | Freq: Two times a day (BID) | ORAL | Status: DC

## 2014-12-08 MED ORDER — IOHEXOL 350 MG/ML SOLN: 100.0000 mL | Freq: Once | INTRAVENOUS | Status: DC | PRN

## 2014-12-08 MED ORDER — HEPARIN BOLUS VIA INFUSION
5000.0000 [IU] | Freq: Once | INTRAVENOUS | Status: AC
  Administered 2014-12-08: 5000 [IU] via INTRAVENOUS

## 2014-12-08 MED ORDER — SODIUM CHLORIDE 0.9 % IV SOLN
INTRAVENOUS | Status: AC
  Administered 2014-12-08: 15:00:00 via INTRAVENOUS

## 2014-12-08 NOTE — ED Notes (Signed)
Attempted to call report to ICU -- nurse still to be assigned to room.

## 2014-12-08 NOTE — ED Notes (Signed)
Having pain to left lower leg, rates pain 10/10.  Swelling noted to left lower leg.  History of blood clot to right leg 4 years ago.  Currently not on any anti-coagulant.  Took ASA last night at 0900 along with 2 Advil.  Seen by PCP about 2 months for left leg pain and results were negative.

## 2014-12-08 NOTE — Progress Notes (Signed)
ANTICOAGULATION CONSULT NOTE - Initial Consult  Pharmacy Consult for Heparin Indication: pulmonary embolus  No Known Allergies  Patient Measurements: Height: 5\' 9"  (175.3 cm) Weight: 208 lb (94.348 kg) IBW/kg (Calculated) : 70.7 HEPARIN DW (KG): 90.2  Vital Signs: Temp: 98 F (36.7 C) (10/24 1141) Temp Source: Oral (10/24 1141) BP: 127/85 mmHg (10/24 1211) Pulse Rate: 50 (10/24 1211)  Labs:  Recent Labs  12/08/14 1000  HGB 13.7  HCT 40.9  PLT 226  CREATININE 1.35*   Estimated Creatinine Clearance: 70 mL/min (by C-G formula based on Cr of 1.35).  Medical History: Past Medical History  Diagnosis Date  . Deep vein thrombosis ([facility]) 2012    Right lower extremity venous ultrasound on Jun 25, 2010 showed DVT  involving the popliteal vein below the knee and paraposterior   tibial veins of calf.  Suspicion of chronic DVT as there was a    prominent collateral vein and partial recannulization of the  popliteal vein and posterior tibial veins  . Chronic lower back pain     and neck pain  . Chronic neck pain    Assessment: 54yo male c/o pain in lower leg, swelling noted to leg.  Pt w/ h/o blood clots.  Asked to initiate Heparin for PE.   Goal of Therapy:  Heparin level 0.3-0.7 units/ml Monitor platelets by anticoagulation protocol: Yes   Plan:  Heparin 5000 units IV bolus now x 1 Heparin infusion at 1450 units/hr Heparin level in 6-8 hrs then daily CBC daily while on Heparin  Margo AyeHall, Yuki Brunsman A 12/08/2014,12:21 PM

## 2014-12-08 NOTE — H&P (Signed)
[facility]          History and Physical    PCP:   Maricela Curet, MD   EDP: Elnora Morrison, MD  Chief Complaint:  Left leg pain, shortness of breath  HPI: Patient is a 54 year old man with prior history of right leg DVT in 2012 with history of noncompliance with Coumadin treatment who presents to the hospital today with about a one-month history of left leg pain. Pain is mainly behind his knee and travels up into his thigh, is worse when he walks. He has also noticed over the past 2 to 3 days that he is becoming more short of breath especially when walking up a hill or flight of steps. He needs to stop and catch his breath which is not normal for him. In the ED he was found to have a left lower extremity DVT and on CT angiogram a PE with extensive clot burden and evidence of right heart strain and we have been asked to admit him for further evaluation and management. He is currently hemodynamically stable.  Allergies:  No Known Allergies    Past Medical History  Diagnosis Date  . Deep vein thrombosis ([place]) 2012    Right lower extremity venous ultrasound on Jun 25, 2010 showed DVT  involving the popliteal vein below the knee and paraposterior   tibial veins of calf.  Suspicion of chronic DVT as there was a    prominent collateral vein and partial recannulization of the  popliteal vein and posterior tibial veins  . Chronic lower back pain     and neck pain  . Chronic neck pain     Past Surgical History  Procedure Laterality Date  . Cervical fusion      Prior to Admission medications   Medication Sig Start Date End Date Taking? Authorizing Provider  aspirin EC 81 MG tablet Take 81 mg by mouth daily as needed (blood thinner).   Yes Historical Provider, MD  HYDROcodone-acetaminophen (NORCO/VICODIN) 5-325 MG per tablet Take 1 tablet by mouth every 6 (six) hours as needed for moderate pain. 03/14/13  Yes Christopher Lawyer, PA-C  ibuprofen (ADVIL,MOTRIN)  200 MG tablet Take 400-600 mg by mouth 3 (three) times daily as needed for mild pain.    Yes Historical Provider, MD    Social History:  reports that he has been smoking.  He has never used smokeless tobacco. He reports that he drinks about 7.0 oz of alcohol per week. He reports that he does not use illicit drugs.  Family History  Problem Relation Age of Onset  . Diabetes Other     Review of Systems:  Constitutional: Denies fever, chills, diaphoresis, appetite change and fatigue.  HEENT: Denies photophobia, eye pain, redness, hearing loss, ear pain, congestion, sore throat, rhinorrhea, sneezing, mouth sores, trouble swallowing, neck pain, neck stiffness and tinnitus.   Respiratory: Denies  cough, chest tightness,  and wheezing.   Cardiovascular: Denies chest pain, palpitations and leg swelling.  Gastrointestinal: Denies nausea, vomiting, abdominal pain, diarrhea, constipation, blood in stool and abdominal distention.  Genitourinary: Denies dysuria, urgency, frequency, hematuria, flank pain and difficulty urinating.  Endocrine: Denies: hot or cold intolerance, sweats, changes in hair or nails, polyuria, polydipsia. Musculoskeletal: Denies myalgias, back pain, joint swelling, arthralgias and gait problem.  Skin: Denies pallor, rash and wound.  Neurological: Denies dizziness, seizures, syncope, weakness, light-headedness, numbness and headaches.  Hematological: Denies adenopathy. Easy bruising,  personal or family bleeding history  Psychiatric/Behavioral: Denies suicidal ideation, mood changes, confusion, nervousness, sleep disturbance and agitation   Physical Exam: Blood pressure 137/78, pulse 50, temperature 97.6 F (36.4 C), temperature source Oral, resp. rate 20, height 5' 9" (1.753 m), weight 95.3 kg (210 lb 1.6 oz), SpO2 99 %. General: Alert, awake, oriented 3, no current distress. HEENT: Normocephalic, atraumatic, pupils equal round and reactive to light, extraocular movements  intact, moist mucous membranes.  Neck: Supple, no JVD, lymphadenopathy, no bruits, no goiter. Cardiovascular: Regular rate and rhythm, no murmurs, rubs or gallops. Lungs: Clear to auscultation bilaterally. Abdomen: Obese, soft, nontender, nondistended, positive bowel sounds.  Extremities: Right, cyanosis or edema positive pulses, left with significant edema and tightness from the right calf all the way up to the thigh, positive pulses. Neurologic: Grossly intact and nonfocal  Labs on Admission:  Results for orders placed or performed during the hospital encounter of 12/08/14 (from the past 48 hour(s))  CBC     Status: None   Collection Time: 12/08/14 10:00 AM  Result Value Ref Range   WBC 6.1 4.0 - 10.5 K/uL   RBC 4.35 4.22 - 5.81 MIL/uL   Hemoglobin 13.7 13.0 - 17.0 g/dL   HCT 40.9 39.0 - 52.0 %   MCV 94.0 78.0 - 100.0 fL   MCH 31.5 26.0 - 34.0 pg   MCHC 33.5 30.0 - 36.0 g/dL   RDW 13.1 11.5 - 15.5 %   Platelets 226 150 - 400 K/uL  Basic metabolic panel     Status: Abnormal   Collection Time: 12/08/14 10:00 AM  Result Value Ref Range   Sodium 140 135 - 145 mmol/L   Potassium 4.2 3.5 - 5.1 mmol/L   Chloride 106 101 - 111 mmol/L   CO2 27 22 - 32 mmol/L   Glucose, Bld 103 (H) 65 - 99 mg/dL   BUN 13 6 - 20 mg/dL   Creatinine, Ser 1.35 (H) 0.61 - 1.24 mg/dL   Calcium 9.6 8.9 - 10.3 mg/dL   GFR calc non Af Amer 58 (L) >60 mL/min   GFR calc Af Amer >60 >60 mL/min    Comment: (NOTE) The eGFR has been calculated using the CKD EPI equation. This calculation has not been validated in all clinical situations. eGFR's persistently <60 mL/min signify possible Chronic Kidney Disease.    Anion gap 7 5 - 15  Troponin I     Status: Abnormal   Collection Time: 12/08/14 10:00 AM  Result Value Ref Range   Troponin I 0.06 (H) <0.031 ng/mL    Comment:        PERSISTENTLY INCREASED TROPONIN VALUES IN THE RANGE OF 0.04-0.49 ng/mL CAN BE SEEN IN:       -UNSTABLE ANGINA       -CONGESTIVE  HEART FAILURE       -MYOCARDITIS       -CHEST TRAUMA       -ARRYHTHMIAS       -LATE PRESENTING MYOCARDIAL INFARCTION       -COPD   CLINICAL FOLLOW-UP RECOMMENDED.   Brain natriuretic peptide     Status: None   Collection Time: 12/08/14 10:00 AM  Result Value Ref Range   B Natriuretic Peptide 43.0 0.0 - 100.0 pg/mL    Radiological Exams on Admission: Ct Angio Chest Pe W/cm &/or Wo Cm  12/08/2014  CLINICAL DATA:  Left leg swelling for the past 4-5 months. No current chest complaints. Dyspnea by history. EXAM: CT ANGIOGRAPHY CHEST WITH  CONTRAST TECHNIQUE: Multidetector CT imaging of the chest was performed using the standard protocol during bolus administration of intravenous contrast. Multiplanar CT image reconstructions and MIPs were obtained to evaluate the vascular anatomy. CONTRAST:  100mL OMNIPAQUE IOHEXOL 350 MG/ML SOLN COMPARISON:  Chest radiographs dated 03/04/2013. FINDINGS: Multiple large, moderate-sized and smaller pulmonary arterial filling defects bilaterally. These are centrally and peripherally located. The right ventricular to left ventricular ratio was 1.2. No lung nodules or enlarged lymph nodes. No pleural fluid. Thoracic and lower cervical spine degenerative changes. Unremarkable upper abdomen. Review of the MIP images confirms the above findings. IMPRESSION: Multiple large, moderate-sized and smaller bilateral pulmonary emboli a centrally and peripherally with a right ventricular 2 left ventricular ratio of 1.2 consistent with at least submassive (intermediate risk) PE. The presence of right heart strain has been associated with an increased risk of morbidity and mortality. Please activate Code PE by paging [phone]. Critical Value/emergent results were called by telephone at the time of interpretation on 12/08/2014 at 11:45 am to Dr. JOSHUA ZAVITZ , who verbally acknowledged these results. Electronically Signed   By: Steven  Reid M.D.   On: 12/08/2014 11:53   Us Venous Img  Lower Unilateral Left  12/08/2014  CLINICAL DATA:  Left leg swelling and dyspnea for 4 months. EXAM: LEFT LOWER EXTREMITY VENOUS DOPPLER ULTRASOUND TECHNIQUE: Gray-scale sonography with graded compression, as well as color Doppler and duplex ultrasound were performed to evaluate the lower extremity deep venous systems from the level of the common femoral vein and including the common femoral, femoral, profunda femoral, popliteal and calf veins including the posterior tibial, peroneal and gastrocnemius veins when visible. The superficial great saphenous vein was also interrogated. Spectral Doppler was utilized to evaluate flow at rest and with distal augmentation maneuvers in the common femoral, femoral and popliteal veins. COMPARISON:  None. FINDINGS: Contralateral Common Femoral Vein: Respiratory phasicity is normal and symmetric with the symptomatic side. No evidence of thrombus. Normal compressibility. Common Femoral Vein: No evidence of thrombus. Normal compressibility, respiratory phasicity and response to augmentation. Saphenofemoral Junction: No evidence of thrombus. Normal compressibility and flow on color Doppler imaging. Profunda Femoral Vein: No evidence of thrombus. Normal compressibility and flow on color Doppler imaging. Femoral Vein: Occlusive thrombus is identified within the distal left femoral vein. Popliteal Vein: Occlusive thrombus is identified within the left popliteal vein. Calf Veins: Additional thrombus is seen within the underlying calf veins. Superficial Great Saphenous Vein: No evidence of thrombus. Normal compressibility and flow on color Doppler imaging. Venous Reflux:  None. Other Findings:  None. IMPRESSION: 1. Occlusive DVT within the distal left femoral vein and popliteal vein. Additional thrombus within the underlying calf veins. 2. 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 These  results will be called to the ordering clinician or representative by the Radiologist Assistant, and communication documented in the PACS or zVision Dashboard. Electronically Signed   By: Stan  Maynard M.D.   On: 12/08/2014 11:06    Assessment/Plan Principal Problem:   Acute pulmonary embolism (HCC) Active Problems:   Deep vein thrombosis (HCC)   CKD (chronic kidney disease) stage 2, GFR 60-89 ml/min   Pulmonary embolism (HCC)     Left lower extremity DVT/PE -He has no risk factors for PE other than obesity and interestingly in 2012 he had a right lower extremity DVT for which he discontinued treatment with Coumadin ahead of time. -Given this is his second   episode I would recommend lifelong anticoagulation at this point. -We'll start on Eliquis 10 mg twice a day. We'll ask case management to run this through insurance approval as I believe this is the best drug for him given his history of noncompliance. -We'll order a hypercoagulable panel as I cannot identify any risk factors for his VTE. -He has evidence of right heart strain on CT, we'll cycle troponins and check 2-D echo. -He is currently hemodynamically stable, nonetheless will admit to stepdown to monitor him for the first 24 hours of his hospitalization given high risk of decompensation.  Chronic kidney disease stage II -Creatinine appears to be at baseline of 1.2-1.3.  DVT prophylaxis -Fully anticoagulated on Eliquis  CODE STATUS -Full code  Dr. DonDiego to assume care of this patient in the a.m.   Time Spent on Admission: 85 minutes  HERNANDEZ ACOSTA,ESTELA [facility] Pager: [phone] 12/08/2014, 6:39 PM     

## 2014-12-08 NOTE — ED Provider Notes (Addendum)
CSN: [id number]     Arrival date & time 12/08/14  4098 History  By signing my name below, I, Marica Otter, attest that this documentation has been prepared under the direction and in the presence of Blane Ohara, MD. Electronically Signed: Marica Otter, ED Scribe. 12/08/2014. 10:15 AM.  Chief Complaint  Patient presents with  . Leg Pain    left  . Leg Swelling    left   The history is provided by the patient. No language interpreter was used.   PCP: Isabella Stalling, MD HPI Comments: Danny Fox is a 54 y.o. male, with PMHx noted below including blood clot in right leg 4 years ago, who presents to the Emergency Department complaining of atraumatic, severe, ongoing left leg pain with associated swelling onset 4 months ago. Associated Sx include intermittent SOB with exertion.  Pt denies blood thinner use, recent surgeries, cancer Tx, fever, chills, chest pain, or any other Sx at this time.   Past Medical History  Diagnosis Date  . Deep vein thrombosis (HCC) 2012    Right lower extremity venous ultrasound on Jun 25, 2010 showed DVT  involving the popliteal vein below the knee and paraposterior   tibial veins of calf.  Suspicion of chronic DVT as there was a    prominent collateral vein and partial recannulization of the  popliteal vein and posterior tibial veins  . Chronic lower back pain     and neck pain  . Chronic neck pain    Past Surgical History  Procedure Laterality Date  . Cervical fusion     Family History  Problem Relation Age of Onset  . Diabetes Other    Social History  Substance Use Topics  . Smoking status: Never Smoker   . Smokeless tobacco: Never Used  . Alcohol Use: 7.0 oz/week    14 drink(s) per week     Comment: beer one every 2 days    Review of Systems  Constitutional: Negative for fever and chills.  Respiratory: Positive for shortness of breath.   Cardiovascular: Positive for leg swelling. Negative for chest pain.  Musculoskeletal: Positive  for arthralgias (Left leg pain).  Hematological: Does not bruise/bleed easily.  All other systems reviewed and are negative.  Allergies  Review of patient's allergies indicates no known allergies.  Home Medications   Prior to Admission medications   Medication Sig Start Date End Date Taking? Authorizing Provider  aspirin EC 81 MG tablet Take 81 mg by mouth daily as needed (blood thinner).   Yes Historical Provider, MD  HYDROcodone-acetaminophen (NORCO/VICODIN) 5-325 MG per tablet Take 1 tablet by mouth every 6 (six) hours as needed for moderate pain. 03/14/13  Yes Christopher Lawyer, PA-C  ibuprofen (ADVIL,MOTRIN) 200 MG tablet Take 400-600 mg by mouth 3 (three) times daily as needed for mild pain.    Yes Historical Provider, MD   Triage Vitals: BP 127/82 mmHg  Pulse 60  Temp(Src) 97.5 F (36.4 C) (Oral)  Resp 18  Ht 5\' 9"  (1.753 m)  Wt 208 lb (94.348 kg)  BMI 30.70 kg/m2  SpO2 98% Physical Exam  Constitutional: He is oriented to person, place, and time. He appears well-developed and well-nourished.  HENT:  Head: Normocephalic.  Eyes: EOM are normal.  Neck: Normal range of motion. Neck supple.  Cardiovascular: Regular rhythm.   Pulmonary/Chest: Effort normal.  Left Foot: popliteal fossa tenderness, swelling 1+ edema with warmth to left foot. 2+ dorsalis pedis pulse. 2+ posterior tibial pulse.   Abdominal: He  exhibits no distension.  Musculoskeletal: Normal range of motion.  Neurological: He is alert and oriented to person, place, and time.  Psychiatric: He has a normal mood and affect.  Nursing note and vitals reviewed.   ED Course  Procedures (including critical care time) CRITICAL CARE Performed by: Enid Skeens   Total critical care time: 35 min  Critical care time was exclusive of separately billable procedures and treating other patients.  Critical care was necessary to treat or prevent imminent or life-threatening deterioration.  Critical care was time  spent personally by me on the following activities: development of treatment plan with patient and/or surrogate as well as nursing, discussions with consultants, evaluation of patient's response to treatment, examination of patient, obtaining history from patient or surrogate, ordering and performing treatments and interventions, ordering and review of laboratory studies, ordering and review of radiographic studies, pulse oximetry and re-evaluation of patient's condition.    EMERGENCY DEPARTMENT [place] CARDIAC EXAM "Study: Limited Ultrasound of the heart and pericardium"  INDICATIONS:Dyspnea Multiple views of the heart and pericardium are obtained with a multi-frequency probe.  PERFORMED [id number]  IMAGES ARCHIVED?: Yes  FINDINGS: No pericardial effusion, Normal contractility and Tamponade physiology absent  LIMITATIONS:  Body habitus  VIEWS USED: Subcostal 4 chamber, Parasternal long axis and Apical 4 chamber   INTERPRETATION: Cardiac activity present, Pericardial effusioin absent and Cardiac tamponade absent  COMMENT:  Mild right heart strain  DIAGNOSTIC STUDIES: Oxygen Saturation is 98% on RA, nl by my interpretation.    COORDINATION OF CARE: 9:51 AM: Discussed treatment plan which includes imaging, with pt at bedside; patient verbalizes understanding and agrees with treatment plan. 11:10 AM: Spoke with radiologist confirming DVT RLE.  Imaging Review Ct Angio Chest Pe W/cm &/or Wo Cm  12/08/2014  CLINICAL DATA:  Left leg swelling for the past 4-5 months. No current chest complaints. Dyspnea by history. EXAM: CT ANGIOGRAPHY CHEST WITH CONTRAST TECHNIQUE: Multidetector CT imaging of the chest was performed using the standard protocol during bolus administration of intravenous contrast. Multiplanar CT image reconstructions and MIPs were obtained to evaluate the vascular anatomy. CONTRAST:  OMNIPAQUE IOHEXOL 350 MG/ML SOLN COMPARISON:  Chest radiographs dated 03/04/2013. FINDINGS:  Multiple large, moderate-sized and smaller pulmonary arterial filling defects bilaterally. These are centrally and peripherally located. The right ventricular to left ventricular ratio was 1.2. No lung nodules or enlarged lymph nodes. No pleural fluid. Thoracic and lower cervical spine degenerative changes. Unremarkable upper abdomen. Review of the MIP images confirms the above findings. IMPRESSION: Multiple large, moderate-sized and smaller bilateral pulmonary emboli a centrally and peripherally with a right ventricular 2 left ventricular ratio of 1.2 consistent with at least submassive (intermediate risk) PE. The presence of right heart strain has been associated with an increased risk of morbidity and mortality. Please activate Code PE by paging [phone]. Critical Value/emergent results were called by telephone at the time of interpretation on 12/08/2014 at 11:45 am to Dr. Blane Ohara , who verbally acknowledged these results. Electronically Signed   By: Beckie Salts M.D.   On: 12/08/2014 11:53   US Venous Img Lower Unilateral Left  12/08/2014  CLINICAL DATA:  Left leg swelling and dyspnea for 4 months. EXAM: LEFT LOWER EXTREMITY VENOUS DOPPLER ULTRASOUND TECHNIQUE: Gray-scale sonography with graded compression, as well as color Doppler and duplex ultrasound were performed to evaluate the lower extremity deep venous systems from the level of the common femoral vein and including the common femoral, femoral, profunda femoral, popliteal and calf veins  including the posterior tibial, peroneal and gastrocnemius veins when visible. The superficial great saphenous vein was also interrogated. Spectral Doppler was utilized to evaluate flow at rest and with distal augmentation maneuvers in the common femoral, femoral and popliteal veins. COMPARISON:  None. FINDINGS: Contralateral Common Femoral Vein: Respiratory phasicity is normal and symmetric with the symptomatic side. No evidence of thrombus. Normal  compressibility. Common Femoral Vein: No evidence of thrombus. Normal compressibility, respiratory phasicity and response to augmentation. Saphenofemoral Junction: No evidence of thrombus. Normal compressibility and flow on color Doppler imaging. Profunda Femoral Vein: No evidence of thrombus. Normal compressibility and flow on color Doppler imaging. Femoral Vein: Occlusive thrombus is identified within the distal left femoral vein. Popliteal Vein: Occlusive thrombus is identified within the left popliteal vein. Calf Veins: Additional thrombus is seen within the underlying calf veins. Superficial Great Saphenous Vein: No evidence of thrombus. Normal compressibility and flow on color Doppler imaging. Venous Reflux:  None. Other Findings:  None. IMPRESSION: 1. Occlusive DVT within the distal left femoral vein and popliteal vein. Additional thrombus within the underlying calf veins. 2. 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 These results will be called to the ordering clinician or representative by the Radiologist Assistant, and communication documented in the PACS or zVision Dashboard. Electronically Signed   By: Bary Richard M.D.   On: 12/08/2014 11:06   I have personally reviewed and evaluated these images as part of my medical decision-making.  MDM   Final diagnoses:  Dyspnea  Left leg swelling  Other acute pulmonary embolism (HCC)  Deep vein thrombosis (DVT) of left lower extremity, unspecified chronicity, unspecified vein (HCC)   Patient presents with left leg swelling and pain and shortness of breath. Vital signs overall unremarkable. Ultrasound results discussed with radiology positive for blood clot. CT scan results discussed with radiology concern for significant multiple large pulmonary emboli with mild right heart strain. Page critical care to discuss possible transfer to [facility] for further  evaluation. Patient does not have unknown reason for blood clots at this time. Heparin ordered  On reassessment patient stable. Discussed with critical care at [facility] who recommends admission at [facility], discussed with [facility] who accepted to stepdown.  The patients results and plan were reviewed and discussed.   Any x-rays performed were independently reviewed by myself.   Differential diagnosis were considered with the presenting HPI.  Medications  iohexol (OMNIPAQUE) 350 MG/ML injection 100 mL (not administered)  heparin ADULT infusion 100 units/mL (25000 units/250 mL) (1,450 Units/hr Intravenous New Bag/Given 12/08/14 1308)  0.9 %  sodium chloride infusion (not administered)  sodium chloride 0.9 % bolus 1,000 mL (0 mLs Intravenous Stopped 12/08/14 1130)  iohexol (OMNIPAQUE) 350 MG/ML injection 100 mL (100 mLs Intravenous Contrast Given 12/08/14 1133)  heparin bolus via infusion 5,000 Units (5,000 Units Intravenous Given 12/08/14 1308)    Filed Vitals:   12/08/14 1141 12/08/14 1200 12/08/14 1211 12/08/14 1230  BP: 118/76 127/85 127/85 117/75  Pulse: 79 49 50 48  Temp: 98 F (36.7 C)     TempSrc: Oral     Resp: 14  18   Height:      Weight:      SpO2: 92% 98% 99% 100%    Final diagnoses:  Dyspnea  Left leg swelling  Other acute pulmonary embolism (HCC)  Deep vein thrombosis (DVT) of left lower extremity, unspecified chronicity, unspecified vein (  HCC)    Admission/ observation were discussed with the admitting physician, patient and/or family and they are comfortable with the plan.    Blane OharaJoshua Kayven Aldaco, MD 12/08/14 1350  Blane OharaJoshua Glennys Schorsch, MD 12/08/14 1351

## 2014-12-08 NOTE — ED Notes (Signed)
Md has been in to inform pt of results -- pt stated that MD informed him he was going to be admitted .

## 2014-12-09 ENCOUNTER — Observation Stay (HOSPITAL_BASED_OUTPATIENT_CLINIC_OR_DEPARTMENT_OTHER): Payer: Medicare HMO

## 2014-12-09 DIAGNOSIS — I2699 Other pulmonary embolism without acute cor pulmonale: Secondary | ICD-10-CM | POA: Diagnosis not present

## 2014-12-09 DIAGNOSIS — Z79899 Other long term (current) drug therapy: Secondary | ICD-10-CM | POA: Diagnosis not present

## 2014-12-09 LAB — BASIC METABOLIC PANEL
Anion gap: 7 (ref 5–15)
BUN: 14 mg/dL (ref 6–20)
CHLORIDE: 107 mmol/L (ref 101–111)
CO2: 26 mmol/L (ref 22–32)
Calcium: 8.8 mg/dL — ABNORMAL LOW (ref 8.9–10.3)
Creatinine, Ser: 1.44 mg/dL — ABNORMAL HIGH (ref 0.61–1.24)
GFR calc non Af Amer: 53 mL/min — ABNORMAL LOW (ref >60)
Glucose, Bld: 99 mg/dL (ref 65–99)
POTASSIUM: 4.2 mmol/L (ref 3.5–5.1)
SODIUM: 140 mmol/L (ref 135–145)

## 2014-12-09 LAB — CBC
HEMATOCRIT: 35.7 % — AB (ref 39.0–52.0)
HEMOGLOBIN: 11.9 g/dL — AB (ref 13.0–17.0)
MCH: 31.2 pg (ref 26.0–34.0)
MCHC: 33.3 g/dL (ref 30.0–36.0)
MCV: 93.5 fL (ref 78.0–100.0)
Platelets: 199 10*3/uL (ref 150–400)
RBC: 3.82 MIL/uL — AB (ref 4.22–5.81)
RDW: 13 % (ref 11.5–15.5)
WBC: 6.6 10*3/uL (ref 4.0–10.5)

## 2014-12-09 LAB — ANTITHROMBIN III
AntiThromb III Func: 94 % (ref 75–120)
AntiThromb III Func: 103 % (ref 75–120)

## 2014-12-09 LAB — TROPONIN I
Troponin I: 0.04 ng/mL — ABNORMAL HIGH (ref <0.031)
Troponin I: 0.03 ng/mL (ref <0.031)

## 2014-12-09 MED ORDER — APIXABAN 5 MG PO TABS: 5.0000 mg | ORAL_TABLET | Freq: Two times a day (BID) | ORAL | Status: DC

## 2014-12-09 NOTE — Progress Notes (Signed)
Patient with some dyspnea at rest or cough no sputum no hemoptysis currently on IV heparin Eliquis added echo shows RV strain pattern pressure overload otherwise hemodynamically stable LARAY CORBIT ZOX:[id number] DOB: [birth year] DOA: 12/08/2014 PCP: Isabella Stalling, MD             Physical Exam: Blood pressure 108/75, pulse 57, temperature 97.8 F (36.6 C), temperature source Oral, resp. rate 20, height  (1.753 m), weight 212 lb 1.3 oz (96.2 kg), SpO2 96 %. neck no JVD no carotid bruits no thyromegaly lungs diminished breath sounds both bases right greater than left no rales wheeze rhonchi appreciable heart regular rhythm no S3 auscultated no S4 no heaves or rubs extremities mild left lower extremity swelling greater than right no forewarning or tenderness noted Homans sign negative   Investigations:  Recent Results (from the past 240 hour(s))  MRSA PCR Screening     Status: None   Collection Time: 12/08/14  4:00 PM  Result Value Ref Range Status   MRSA by PCR NEGATIVE NEGATIVE Final    Comment:        The GeneXpert MRSA Assay (FDA approved for NASAL specimens only), is one component of a comprehensive MRSA colonization surveillance program. It is not intended to diagnose MRSA infection nor to guide or monitor treatment for MRSA infections.      Basic Metabolic Panel:  Recent Labs  81/19/14 1000 12/09/14 0200  NA 140 140  K 4.2 4.2  CL 106 107  CO2 27 26  GLUCOSE 103* 99  BUN 13 14  CREATININE 1.35* 1.44*  CALCIUM 9.6 8.8*   Liver Function Tests: No results for input(s): AST, ALT, ALKPHOS, BILITOT, PROT, ALBUMIN in the last 72 hours.   CBC:  Recent Labs  12/08/14 1000 12/09/14 0200  WBC 6.1 6.6  HGB 13.7 11.9*  HCT 40.9 35.7*  MCV 94.0 93.5  PLT 226 199    Ct Angio Chest Pe W/cm &/or Wo Cm  12/08/2014  CLINICAL DATA:  Left leg swelling for the past 4-5 months. No current chest complaints. Dyspnea by history. EXAM: CT ANGIOGRAPHY CHEST  WITH CONTRAST TECHNIQUE: Multidetector CT imaging of the chest was performed using the standard protocol during bolus administration of intravenous contrast. Multiplanar CT image reconstructions and MIPs were obtained to evaluate the vascular anatomy. CONTRAST:  OMNIPAQUE IOHEXOL 350 MG/ML SOLN COMPARISON:  Chest radiographs dated 03/04/2013. FINDINGS: Multiple large, moderate-sized and smaller pulmonary arterial filling defects bilaterally. These are centrally and peripherally located. The right ventricular to left ventricular ratio was 1.2. No lung nodules or enlarged lymph nodes. No pleural fluid. Thoracic and lower cervical spine degenerative changes. Unremarkable upper abdomen. Review of the MIP images confirms the above findings. IMPRESSION: Multiple large, moderate-sized and smaller bilateral pulmonary emboli a centrally and peripherally with a right ventricular 2 left ventricular ratio of 1.2 consistent with at least submassive (intermediate risk) PE. The presence of right heart strain has been associated with an increased risk of morbidity and mortality. Please activate Code PE by paging [phone]. Critical Value/emergent results were called by telephone at the time of interpretation on 12/08/2014 at 11:45 am to Dr. Blane Ohara , who verbally acknowledged these results. Electronically Signed   By: Beckie Salts M.D.   On: 12/08/2014 11:53   US Venous Img Lower Unilateral Left  12/08/2014  CLINICAL DATA:  Left leg swelling and dyspnea for 4 months. EXAM: LEFT LOWER EXTREMITY VENOUS DOPPLER ULTRASOUND TECHNIQUE: Gray-scale sonography with graded compression, as well  as color Doppler and duplex ultrasound were performed to evaluate the lower extremity deep venous systems from the level of the common femoral vein and including the common femoral, femoral, profunda femoral, popliteal and calf veins including the posterior tibial, peroneal and gastrocnemius veins when visible. The superficial great  saphenous vein was also interrogated. Spectral Doppler was utilized to evaluate flow at rest and with distal augmentation maneuvers in the common femoral, femoral and popliteal veins. COMPARISON:  None. FINDINGS: Contralateral Common Femoral Vein: Respiratory phasicity is normal and symmetric with the symptomatic side. No evidence of thrombus. Normal compressibility. Common Femoral Vein: No evidence of thrombus. Normal compressibility, respiratory phasicity and response to augmentation. Saphenofemoral Junction: No evidence of thrombus. Normal compressibility and flow on color Doppler imaging. Profunda Femoral Vein: No evidence of thrombus. Normal compressibility and flow on color Doppler imaging. Femoral Vein: Occlusive thrombus is identified within the distal left femoral vein. Popliteal Vein: Occlusive thrombus is identified within the left popliteal vein. Calf Veins: Additional thrombus is seen within the underlying calf veins. Superficial Great Saphenous Vein: No evidence of thrombus. Normal compressibility and flow on color Doppler imaging. Venous Reflux:  None. Other Findings:  None. IMPRESSION: 1. Occlusive DVT within the distal left femoral vein and popliteal vein. Additional thrombus within the underlying calf veins. 2. 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 These results will be called to the ordering clinician or representative by the Radiologist Assistant, and communication documented in the PACS or zVision Dashboard. Electronically Signed   By: Bary RichardStan  Maynard M.D.   On: 12/08/2014 11:06      Medications:   Impression:  Principal Problem:   Acute pulmonary embolism (HCC) Active Problems:   Deep vein thrombosis (HCC)   CKD (chronic kidney disease) stage 2, GFR 60-89 ml/min   Pulmonary embolism (HCC)     Plan: Continue IV heparin continue Eliquis twice a day we'll monitor for signs of RV  decompensation and/or pneumonitis  Consultants:    Procedures   Antibiotics:                   Code Status: Full   Family Communication:  Spoke with patient and son at bedside  Disposition Plan see plan above  Time spent: 30 minutes   LOS: 1 day   Jaynell Castagnola M   12/09/2014, 12:39 PM

## 2014-12-09 NOTE — Discharge Instructions (Signed)
Information on my medicine - ELIQUIS (apixaban)  This medication education was reviewed with me or my healthcare representative as part of my discharge preparation.  The pharmacist that spoke with me during my hospital stay was:  Wayland DenisHall, Doyt Castellana A, [facility]  Why was Eliquis prescribed for you? Eliquis was prescribed to treat blood clots that may have been found in the veins of your legs (deep vein thrombosis) or in your lungs (pulmonary embolism) and to reduce the risk of them occurring again.  What do You need to know about Eliquis ? The starting dose is 10 mg (two 5 mg tablets) taken TWICE daily for the FIRST SEVEN (7) DAYS, then on (enter date)  12/14/2014  the dose is reduced to ONE 5 mg tablet taken TWICE daily.  Eliquis may be taken with or without food.   Try to take the dose about the same time in the morning and in the evening. If you have difficulty swallowing the tablet whole please discuss with your pharmacist how to take the medication safely.  Take Eliquis exactly as prescribed and DO NOT stop taking Eliquis without talking to the doctor who prescribed the medication.  Stopping may increase your risk of developing a new blood clot.  Refill your prescription before you run out.  After discharge, you should have regular check-up appointments with your healthcare provider that is prescribing your Eliquis.    What do you do if you miss a dose? If a dose of ELIQUIS is not taken at the scheduled time, take it as soon as possible on the same day and twice-daily administration should be resumed. The dose should not be doubled to make up for a missed dose.  Important Safety Information A possible side effect of Eliquis is bleeding. You should call your healthcare provider right away if you experience any of the following: ? Bleeding from an injury or your nose that does not stop. ? Unusual colored urine (red or dark brown) or unusual colored stools (red or black). ? Unusual bruising for  unknown reasons. ? A serious fall or if you hit your head (even if there is no bleeding).  Some medicines may interact with Eliquis and might increase your risk of bleeding or clotting while on Eliquis. To help avoid this, consult your healthcare provider or pharmacist prior to using any new prescription or non-prescription medications, including herbals, vitamins, non-steroidal anti-inflammatory drugs (NSAIDs) and supplements.  This website has more information on Eliquis (apixaban): http://www.eliquis.com/eliquis/home

## 2014-12-09 NOTE — Care Management Note (Signed)
Case Management Note  Patient Details  Name: Danny Fox MRN: [id number] Date of Birth: June 10, 1960  Subjective/Objective:                  Pt is from home, lives with parents. Pt is ind with ADL's. Pt admitted for PE. Pt has no HH services, DME's or med needs prior to admission.   Action/Plan: Benefits check completed for Eliquis. Pt's co-pay will be $47/30 day supply. Pt says he can afford to pay this. Pt given card for first 30 day free. Pt plans to return home with self care at DC. No further CM needs noted.   Expected Discharge Date:  12/11/14               Expected Discharge Plan:  Home/Self Care  In-House Referral:  NA  Discharge planning Services  CM Consult  Post Acute Care Choice:  NA Choice offered to:  NA  DME Arranged:    DME Agency:     HH Arranged:    HH Agency:     Status of Service:  Completed, signed off  Medicare Important Message Given:    Date Medicare IM Given:    Medicare IM give by:    Date Additional Medicare IM Given:    Additional Medicare Important Message give by:     If discussed at Long Length of Stay Meetings, dates discussed:    Additional Comments:  [name], [name], RN 12/09/2014, 1:36 PM

## 2014-12-10 LAB — HOMOCYSTEINE
HOMOCYSTEINE-NORM: 7.6 umol/L (ref 0.0–15.0)
Homocysteine: 7.6 umol/L (ref 0.0–15.0)

## 2014-12-10 LAB — CARDIOLIPIN ANTIBODIES, IGG, IGM, IGA: Anticardiolipin IgA: <9 APL U/mL (ref 0–11)

## 2014-12-10 LAB — PROTEIN C, TOTAL: PROTEIN C, TOTAL: 92 % (ref 60–150)

## 2014-12-10 MED ORDER — APIXABAN 5 MG PO TABS: 10.0000 mg | ORAL_TABLET | Freq: Two times a day (BID) | ORAL | Status: AC

## 2014-12-10 NOTE — Care Management Note (Signed)
Case Management Note  Patient Details  Name: Ronnald NianDarryl T Suazo MRN: [id number] Date of Birth: Jun 25, 1960  Subjective/Objective:                    Action/Plan: Pt discharging home today. NO CM needs.   Expected Discharge Date:  12/11/14               Expected Discharge Plan:  Home/Self Care  In-House Referral:  NA  Discharge planning Services  CM Consult  Post Acute Care Choice:  NA Choice offered to:  NA  DME Arranged:    DME Agency:     HH Arranged:    HH Agency:     Status of Service:  Completed, signed off  Medicare Important Message Given:    Date Medicare IM Given:    Medicare IM give by:    Date Additional Medicare IM Given:    Additional Medicare Important Message give by:     If discussed at Long Length of Stay Meetings, dates discussed:    Additional Comments:  [name], Kani Chauvin Demske, RN 12/10/2014, 11:28 AM

## 2014-12-10 NOTE — Discharge Summary (Signed)
Physician Discharge Summary  Danny Fox OZH:[id number] DOB: 1960/09/15 DOA: 12/08/2014  PCP: Isabella Stalling, MD  Admit date: 12/08/2014 Discharge date: 12/10/2014   Recommendations for Outpatient Follow-up: Patient is advised to take Eliquis 10 mg by mouth twice a day until Monday and follow-up my office on Monday. He will then be started on Eliquis 5 mg by mouth twice a day which is reduced dosage after starting dosage of 7 days he is told to ambulate around the house he does not smoke cigarettes his anti-coagulation hypercoagulation profiles essentially negative  Discharge Diagnoses:  Principal Problem:   Acute pulmonary embolism (HCC) Active Problems:   Deep vein thrombosis (HCC)   CKD (chronic kidney disease) stage 2, GFR 60-89 ml/min   Pulmonary embolism [facility])   Discharge Condition: Good  Filed Weights   12/08/14 1600 12/09/14 0355 12/10/14 0400  Weight: 210 lb 1.6 oz (95.3 kg) 212 lb 1.3 oz (96.2 kg) 210 lb 5.1 oz (95.4 kg)    History of present illness:  Patient has a history of DVT 6 years ago was placed on Coumadin for 6-12 months he stopped his Coumadin had no significant issues in the interim he presented with a 2 to three-day history of increasing dyspnea progressing to dyspnea on minimal exertion seen in ER found to have extensive DVT in contralateral leg now the left no femoral and popliteal distribution scan of his chest revealed extensive pulmonary him a lot with right ventricular strain pattern he was almost totally hemodynamically stable placed in ICU on heparin for several hours insulin Eliquis 10 twice a day which will continue for a week and then we will see him in the office and decreasing to 5 mg twice a day thereafter his hypercoagulable profile was essentially totally negative  Hospital Course:  See history of present illness above  Procedures:    Consultations:    Discharge Instructions     Medication List    ASK your doctor about  these medications        aspirin EC 81 MG tablet  Take 81 mg by mouth daily as needed (blood thinner).     HYDROcodone-acetaminophen 5-325 MG tablet  Commonly known as:  NORCO/VICODIN  Take 1 tablet by mouth every 6 (six) hours as needed for moderate pain.     ibuprofen 200 MG tablet  Commonly known as:  ADVIL,MOTRIN  Take 400-600 mg by mouth 3 (three) times daily as needed for mild pain.       No Known Allergies    The results of significant diagnostics from this hospitalization (including imaging, microbiology, ancillary and laboratory) are listed below for reference.    Significant Diagnostic Studies: Ct Angio Chest Pe W/cm &/or Wo Cm  12/08/2014  CLINICAL DATA:  Left leg swelling for the past 4-5 months. No current chest complaints. Dyspnea by history. EXAM: CT ANGIOGRAPHY CHEST WITH CONTRAST TECHNIQUE: Multidetector CT imaging of the chest was performed using the standard protocol during bolus administration of intravenous contrast. Multiplanar CT image reconstructions and MIPs were obtained to evaluate the vascular anatomy. CONTRAST:  OMNIPAQUE IOHEXOL 350 MG/ML SOLN COMPARISON:  Chest radiographs dated 03/04/2013. FINDINGS: Multiple large, moderate-sized and smaller pulmonary arterial filling defects bilaterally. These are centrally and peripherally located. The right ventricular to left ventricular ratio was 1.2. No lung nodules or enlarged lymph nodes. No pleural fluid. Thoracic and lower cervical spine degenerative changes. Unremarkable upper abdomen. Review of the MIP images confirms the above findings. IMPRESSION: Multiple large, moderate-sized  and smaller bilateral pulmonary emboli a centrally and peripherally with a right ventricular 2 left ventricular ratio of 1.2 consistent with at least submassive (intermediate risk) PE. The presence of right heart strain has been associated with an increased risk of morbidity and mortality. Please activate Code PE by paging  [phone]. Critical Value/emergent results were called by telephone at the time of interpretation on 12/08/2014 at 11:45 am to Dr. Blane OharaJOSHUA ZAVITZ , who verbally acknowledged these results. Electronically Signed   By: Beckie SaltsSteven  Reid M.D.   On: 12/08/2014 11:53   [place] Venous Img Lower Unilateral Left  12/08/2014  CLINICAL DATA:  Left leg swelling and dyspnea for 4 months. EXAM: LEFT LOWER EXTREMITY VENOUS DOPPLER ULTRASOUND TECHNIQUE: Gray-scale sonography with graded compression, as well as color Doppler and duplex ultrasound were performed to evaluate the lower extremity deep venous systems from the level of the common femoral vein and including the common femoral, femoral, profunda femoral, popliteal and calf veins including the posterior tibial, peroneal and gastrocnemius veins when visible. The superficial great saphenous vein was also interrogated. Spectral Doppler was utilized to evaluate flow at rest and with distal augmentation maneuvers in the common femoral, femoral and popliteal veins. COMPARISON:  None. FINDINGS: Contralateral Common Femoral Vein: Respiratory phasicity is normal and symmetric with the symptomatic side. No evidence of thrombus. Normal compressibility. Common Femoral Vein: No evidence of thrombus. Normal compressibility, respiratory phasicity and response to augmentation. Saphenofemoral Junction: No evidence of thrombus. Normal compressibility and flow on color Doppler imaging. Profunda Femoral Vein: No evidence of thrombus. Normal compressibility and flow on color Doppler imaging. Femoral Vein: Occlusive thrombus is identified within the distal left femoral vein. Popliteal Vein: Occlusive thrombus is identified within the left popliteal vein. Calf Veins: Additional thrombus is seen within the underlying calf veins. Superficial Great Saphenous Vein: No evidence of thrombus. Normal compressibility and flow on color Doppler imaging. Venous Reflux:  None. Other Findings:  None. IMPRESSION:  1. Occlusive DVT within the distal left femoral vein and popliteal vein. Additional thrombus within the underlying calf veins. 2. 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 These results will be called to the ordering clinician or representative by the Radiologist Assistant, and communication documented in the PACS or zVision Dashboard. Electronically Signed   By: Bary RichardStan  Maynard M.D.   On: 12/08/2014 11:06    Microbiology: Recent Results (from the past 240 hour(s))  MRSA PCR Screening     Status: None   Collection Time: 12/08/14  4:00 PM  Result Value Ref Range Status   MRSA by PCR NEGATIVE NEGATIVE Final    Comment:        The GeneXpert MRSA Assay (FDA approved for NASAL specimens only), is one component of a comprehensive MRSA colonization surveillance program. It is not intended to diagnose MRSA infection nor to guide or monitor treatment for MRSA infections.      Labs: Basic Metabolic Panel:  Recent Labs Lab 12/08/14 1000 12/09/14 0200  NA 140 140  K 4.2 4.2  CL 106 107  CO2 27 26  GLUCOSE 103* 99  BUN 13 14  CREATININE 1.35* 1.44*  CALCIUM 9.6 8.8*   Liver Function Tests: No results for input(s): AST, ALT, ALKPHOS, BILITOT, PROT, ALBUMIN in the last 168 hours. No results for input(s): LIPASE, AMYLASE in the last 168 hours. No results for input(s): AMMONIA in the last 168 hours. CBC:  Recent Labs Lab  12/08/14 1000 12/09/14 0200  WBC 6.1 6.6  HGB 13.7 11.9*  HCT 40.9 35.7*  MCV 94.0 93.5  PLT 226 199   Cardiac Enzymes:  Recent Labs Lab 12/08/14 1000 12/08/14 2009 12/09/14 0200 12/09/14 0815  TROPONINI 0.06* 0.05* 0.04* 0.03   BNP: BNP (last 3 results)  Recent Labs  12/08/14 1000  BNP 43.0    ProBNP (last 3 results) No results for input(s): PROBNP in the last 8760 hours.  CBG: No results for input(s): GLUCAP in the last 168  hours.     Signed:  Rochester Serpe Judie Petit  [facility] Pager: [phone] 12/10/2014, 11:02 AM

## 2014-12-10 NOTE — Progress Notes (Signed)
NURSING PROGRESS NOTE  Danny NianDarryl T Fox [id number] Discharge Data: 12/10/2014 12:24 PM Attending Provider: Oval Linseyichard Dondiego, MD EXB:[id number]:DONDIEGO,RICHARD Judie PetitM, MD   Danny Fox to be [facility] per MD order.    All IV's discontinued and monitored for bleeding.  All belongings returned to patient for patient to take home.  AVS and prescriptions reviewed with patient.  Patient left floor via wheelchair, escorted by NT.  Last Documented Vital Signs:  Blood pressure 108/75, pulse 57, temperature 97 F (36.1 C), temperature source Oral, resp. rate 20, height 5\' 9"  (1.753 m), weight 95.4 kg (210 lb 5.1 oz), SpO2 96 %.  Danny Fox, Danny Fox

## 2014-12-11 LAB — BETA-2-GLYCOPROTEIN I ABS, IGG/M/A
Beta-2 Glyco I IgG: <9 GPI IgG units (ref 0–20)
Beta-2-Glycoprotein I IgA: <9 GPI IgA units (ref 0–25)
Beta-2-Glycoprotein I IgM: <9 GPI IgM units (ref 0–32)

## 2014-12-11 LAB — PROTEIN S, TOTAL: PROTEIN S AG TOTAL: 133 % (ref 60–150)

## 2014-12-11 LAB — PROTEIN C ACTIVITY: Protein C Activity: 117 % (ref 73–180)

## 2014-12-11 LAB — PROTEIN S ACTIVITY: PROTEIN S ACTIVITY: 109 % (ref 63–140)

## 2014-12-15 LAB — FACTOR 5 LEIDEN

## 2014-12-15 LAB — PROTHROMBIN GENE MUTATION

## 2014-12-24 LAB — LUPUS ANTICOAGULANT PANEL
DRVVT: 62 s — ABNORMAL HIGH (ref 0.0–44.0)
PTT LA: 40.3 s (ref 0.0–40.6)

## 2014-12-24 LAB — DRVVT CONFIRM: dRVVT Confirm: 1 ratio (ref 0.8–1.2)

## 2014-12-24 LAB — DRVVT MIX: DRVVT MIX: 55.4 s — AB (ref 0.0–44.0)

## 2016-07-04 ENCOUNTER — Emergency Department (HOSPITAL_COMMUNITY): Payer: Medicare HMO

## 2016-07-04 ENCOUNTER — Emergency Department (HOSPITAL_COMMUNITY)
Admission: EM | Admit: 2016-07-04 | Discharge: 2016-07-04 | Disposition: A | Discharge status: Home / Self Care | Payer: Medicare HMO | Attending: Emergency Medicine | Admitting: Emergency Medicine

## 2016-07-04 ENCOUNTER — Encounter (HOSPITAL_COMMUNITY): Payer: Self-pay | Admitting: Emergency Medicine

## 2016-07-04 DIAGNOSIS — Z7901 Long term (current) use of anticoagulants: Secondary | ICD-10-CM | POA: Insufficient documentation

## 2016-07-04 DIAGNOSIS — Z79899 Other long term (current) drug therapy: Secondary | ICD-10-CM | POA: Insufficient documentation

## 2016-07-04 DIAGNOSIS — N182 Chronic kidney disease, stage 2 (mild): Secondary | ICD-10-CM | POA: Diagnosis not present

## 2016-07-04 DIAGNOSIS — Z87891 Personal history of nicotine dependence: Secondary | ICD-10-CM | POA: Insufficient documentation

## 2016-07-04 DIAGNOSIS — M79604 Pain in right leg: Secondary | ICD-10-CM | POA: Diagnosis not present

## 2016-07-04 DIAGNOSIS — Z7982 Long term (current) use of aspirin: Secondary | ICD-10-CM | POA: Insufficient documentation

## 2016-07-04 DIAGNOSIS — R52 Pain, unspecified: Secondary | ICD-10-CM

## 2016-07-04 LAB — CBC WITH DIFFERENTIAL/PLATELET
BASOS PCT: 0 %
Basophils Absolute: 0 10*3/uL (ref 0.0–0.1)
EOS PCT: 2 %
Eosinophils Absolute: 0.1 10*3/uL (ref 0.0–0.7)
HCT: 46.1 % (ref 39.0–52.0)
Hemoglobin: 15.5 g/dL (ref 13.0–17.0)
Lymphocytes Relative: 38 %
Lymphs Abs: 2.7 10*3/uL (ref 0.7–4.0)
MCH: 32.2 pg (ref 26.0–34.0)
MCHC: 33.6 g/dL (ref 30.0–36.0)
MCV: 95.6 fL (ref 78.0–100.0)
MONO ABS: 0.5 10*3/uL (ref 0.1–1.0)
MONOS PCT: 7 %
Neutro Abs: 3.8 10*3/uL (ref 1.7–7.7)
Neutrophils Relative %: 53 %
PLATELETS: 214 10*3/uL (ref 150–400)
RBC: 4.82 MIL/uL (ref 4.22–5.81)
RDW: 14.1 % (ref 11.5–15.5)
WBC: 7.2 10*3/uL (ref 4.0–10.5)

## 2016-07-04 LAB — BASIC METABOLIC PANEL
Anion gap: 7 (ref 5–15)
BUN: 17 mg/dL (ref 6–20)
CO2: 27 mmol/L (ref 22–32)
CREATININE: 1.23 mg/dL (ref 0.61–1.24)
Calcium: 9.7 mg/dL (ref 8.9–10.3)
Chloride: 102 mmol/L (ref 101–111)
GFR calc non Af Amer: >60 mL/min (ref >60)
Glucose, Bld: 93 mg/dL (ref 65–99)
Potassium: 4.2 mmol/L (ref 3.5–5.1)
SODIUM: 136 mmol/L (ref 135–145)

## 2016-07-04 LAB — PROTIME-INR
INR: 0.95
PROTHROMBIN TIME: 12.7 s (ref 11.4–15.2)

## 2016-07-04 NOTE — ED Provider Notes (Signed)
[facility]Y DEPT Provider Note   CSN: [id number] Arrival date & time: 07/04/16  [id number]  By signing my name below, I, Danny Fox, attest that this documentation has been prepared under the direction and in the presence of Mancel Bale, MD. Electronically Signed: Marnette Burgess Fox, Scribe. 07/04/2016. 1:47 PM.  History   Chief Complaint Chief Complaint  Patient presents with  . Leg Pain   The history is provided by the patient and medical records. No language interpreter was used.    HPI Comments:  Danny Fox is a 56 y.o. male with a PMHx of DVT and Chronic Neck/Back pain, who presents to the Emergency Department complaining of sudden onset, aching, 8/10, upper right anterior leg pain beginning yesterday. Pt reports this leg pain arising spontaneously while lying down yesterday. He has a h/o DVT in the affected right leg leading him to be seen in the ED today. He states he has not been taking his Eliquis regularly. Per pt, he has some stated "residual clots in one of my legs" from an US performed ~a month ago. No alleviating factors or home Tx's tried PTA. Pt denies SOB, wheezing, and any other complaints at this time.   Past Medical History:  Diagnosis Date  . Chronic lower back pain    and neck pain  . Chronic neck pain   . Deep vein thrombosis (HCC) 2012   Right lower extremity venous ultrasound on Jun 25, 2010 showed DVT  involving the popliteal vein below the knee and paraposterior   tibial veins of calf.  Suspicion of chronic DVT as there was a    prominent collateral vein and partial recannulization of the  popliteal vein and posterior tibial veins   Patient Active Problem List   Diagnosis Date Noted  . Acute pulmonary embolism (HCC) 12/08/2014  . CKD (chronic kidney disease) stage 2, GFR 60-89 ml/min 12/08/2014  . Pulmonary embolism (HCC) 12/08/2014  . H/O noncompliance with medical treatment, presenting hazards to health 03/27/2012  . Chronic neck pain   . Deep  vein thrombosis (HCC) 06/30/2010   Past Surgical History:  Procedure Laterality Date  . CERVICAL FUSION      Home Medications    Prior to Admission medications   Medication Sig Start Date End Date Taking? Authorizing Provider  apixaban (ELIQUIS) 5 MG TABS tablet Take 2 tablets (10 mg total) by mouth 2 (two) times daily. 12/10/14 12/14/14  Oval Linsey, MD  aspirin EC 81 MG tablet Take 81 mg by mouth daily as needed (blood thinner).    [provider]  HYDROcodone-acetaminophen (NORCO/VICODIN) 5-325 MG per tablet Take 1 tablet by mouth every 6 (six) hours as needed for moderate pain. 03/14/13   Charlestine Night, PA-C    Family History Family History  Problem Relation Age of Onset  . Diabetes Other     Social History Social History  Substance Use Topics  . Smoking status: Former Smoker    Packs/day: 0.25    Quit date: 07/05/2015  . Smokeless tobacco: Never Used  . Alcohol use 7.0 oz/week    14 Standard drinks or equivalent per week     Comment: beer one every 2 days     Allergies   Patient has no known allergies.   Review of Systems Review of Systems  All other systems reviewed and are negative.  Physical Exam Updated Vital Signs BP 128/76   Pulse 65   Temp 97.7 F (36.5 C) (Oral)   Resp 17  Ht 5\' 9"  (1.753 m)   Wt 206 lb (93.4 kg)   SpO2 96%   BMI 30.42 kg/m   Physical Exam  Constitutional: He is oriented to person, place, and time. He appears well-developed and well-nourished.  HENT:  Head: Normocephalic and atraumatic.  Right Ear: External ear normal.  Left Ear: External ear normal.  Eyes: Conjunctivae and EOM are normal. Pupils are equal, round, and reactive to light.  Neck: Normal range of motion and phonation normal. Neck supple.  Cardiovascular: Normal rate, regular rhythm and normal heart sounds.   Pulmonary/Chest: Effort normal and breath sounds normal. He exhibits no bony tenderness.  Abdominal: Soft. There is no tenderness.    Musculoskeletal: Normal range of motion. He exhibits tenderness.  Left knee to thigh mild tenderness without swelling or deformity. No distal swelling or tenderness. Normal ROM right Leg.   Neurological: He is alert and oriented to person, place, and time. No cranial nerve deficit or sensory deficit. He exhibits normal muscle tone. Coordination normal.  Skin: Skin is warm, dry and intact.  Psychiatric: He has a normal mood and affect. His behavior is normal. Judgment and thought content normal.  Nursing note and vitals reviewed.    ED Treatments / Results  DIAGNOSTIC STUDIES:  Oxygen Saturation is 96% on RA, adequate by my interpretation.    COORDINATION OF CARE:  1:47 PM Discussed treatment plan with pt at bedside including [place] Right Leg and blood work and pt agreed to plan.  Labs (all labs ordered are listed, but only abnormal results are displayed) Labs Reviewed  CBC WITH DIFFERENTIAL/PLATELET  BASIC METABOLIC PANEL  PROTIME-INR    EKG  EKG Interpretation None       Radiology US Venous Img Lower Unilateral Right  Result Date: 07/04/2016 CLINICAL DATA:  Right lower extremity pain for 1 day. EXAM: RIGHT LOWER EXTREMITY VENOUS DOPPLER ULTRASOUND TECHNIQUE: Gray-scale sonography with graded compression, as well as color Doppler and duplex ultrasound were performed to evaluate the lower extremity deep venous systems from the level of the common femoral vein and including the common femoral, femoral, profunda femoral, popliteal and calf veins including the posterior tibial, peroneal and gastrocnemius veins when visible. The superficial great saphenous vein was also interrogated. Spectral Doppler was utilized to evaluate flow at rest and with distal augmentation maneuvers in the common femoral, femoral and popliteal veins. COMPARISON:  None. FINDINGS: Contralateral Common Femoral Vein: Respiratory phasicity is normal and symmetric with the symptomatic side. No evidence of thrombus.  Normal compressibility. Common Femoral Vein: No evidence of thrombus. Normal compressibility, respiratory phasicity and response to augmentation. Saphenofemoral Junction: No evidence of thrombus. Normal compressibility and flow on color Doppler imaging. Profunda Femoral Vein: No evidence of thrombus. Normal compressibility and flow on color Doppler imaging. Femoral Vein: No evidence of thrombus. Normal compressibility, respiratory phasicity and response to augmentation. Popliteal Vein: No evidence of thrombus. Normal compressibility, respiratory phasicity and response to augmentation. Calf Veins: No evidence of thrombus. Normal compressibility and flow on color Doppler imaging. Superficial Great Saphenous Vein: No evidence of thrombus. Normal compressibility and flow on color Doppler imaging. Venous Reflux:  None. Other Findings:  None. IMPRESSION: No evidence of DVT within the right lower extremity. Electronically Signed   By: Drusilla Kanner M.D.   On: 07/04/2016 11:14    Procedures Procedures (including critical care time)  Medications Ordered in ED Medications - No data to display   Initial Impression / Assessment and Plan / ED Course  I have reviewed  the triage vital signs and the nursing notes.  Pertinent labs & imaging results that were available during my care of the patient were reviewed by me and considered in my medical decision making (see chart for details).      No data found.   At D/C Reevaluation with update and discussion. After initial assessment and treatment, an updated evaluation reveals no further c/o. PE unchanged. Questions solicited and answered. Nelda Luckey L    Final Clinical Impressions(s) / ED Diagnoses   Final diagnoses:  Pain  Right leg pain   Nonspecific leg pain; doubt DVT, cellulitis or radiculopathy.  Nursing Notes Reviewed/ Care Coordinated Applicable Imaging Reviewed Interpretation of Laboratory Data incorporated into ED treatment  The  patient appears reasonably screened and/or stabilized for discharge and I doubt any other medical condition or other [facility] requiring further screening, evaluation, or treatment in the ED at this time prior to discharge.  Plan: Home Medications- OTC prn; Home Treatments- rest; return here if the recommended treatment, does not improve the symptoms; Recommended follow up- PCP prn   New Prescriptions New Prescriptions   No medications on file    I personally performed the services described in this documentation, which was scribed in my presence. The recorded information has been reviewed and is accurate.     Mancel BaleWentz, Robi Dewolfe, MD 07/06/16 [phone]

## 2016-07-04 NOTE — Discharge Instructions (Signed)
The ultrasound today was completely normal.  There is no sign of residual clot in the veins of the right leg.  Pain is likely muscular in origin.  To help the discomfort, use heat on the sore area and take ibuprofen 3 times a day.

## 2016-07-04 NOTE — ED Notes (Signed)
Dr. Effie ShyWentz in room speaking with pt c/o rt leg pain as previous DVT symptoms. No sob/cp

## 2016-07-04 NOTE — ED Triage Notes (Signed)
Patient complaining of upper right leg pain since yesterday. Denies injury. States he has history of DVT's and is supposed to be on eliquis but ran out last week.

## 2016-08-16 ENCOUNTER — Other Ambulatory Visit (HOSPITAL_COMMUNITY): Payer: Self-pay | Admitting: Family Medicine

## 2016-08-16 DIAGNOSIS — M545 Low back pain: Secondary | ICD-10-CM

## 2016-08-19 ENCOUNTER — Ambulatory Visit (HOSPITAL_COMMUNITY)
Admission: RE | Admit: 2016-08-19 | Discharge: 2016-08-19 | Disposition: A | Discharge status: Home / Self Care | Payer: Medicare HMO | Source: Ambulatory Visit | Attending: Family Medicine | Admitting: Family Medicine

## 2016-08-19 DIAGNOSIS — M4686 Other specified inflammatory spondylopathies, lumbar region: Secondary | ICD-10-CM | POA: Diagnosis not present

## 2016-08-19 DIAGNOSIS — M48061 Spinal stenosis, lumbar region without neurogenic claudication: Secondary | ICD-10-CM | POA: Insufficient documentation

## 2016-08-19 DIAGNOSIS — M5136 Other intervertebral disc degeneration, lumbar region: Secondary | ICD-10-CM | POA: Insufficient documentation

## 2016-08-19 DIAGNOSIS — M545 Low back pain: Secondary | ICD-10-CM

## 2020-08-18 ENCOUNTER — Telehealth (INDEPENDENT_AMBULATORY_CARE_PROVIDER_SITE_OTHER): Payer: Self-pay

## 2020-08-18 NOTE — Telephone Encounter (Signed)
Patient called today and left a voice message to see if we were accepting new patients.  I called this patient back and let him know the medication policy at our office and he stated that he has been taking Oxycodone for a long time for his back pain from back surgery.  Sending to you to be aware and patient did state that he is checking with different offices.

## 2020-08-18 NOTE — Telephone Encounter (Signed)
Patient is aware and let me know that he has reached out to other providers also and is awaiting a call back from them.

## 2020-08-21 ENCOUNTER — Telehealth: Payer: Self-pay

## 2020-08-21 NOTE — Telephone Encounter (Signed)
No new patient per Gray 

## 2022-02-11 ENCOUNTER — Encounter: Payer: Self-pay | Admitting: *Deleted

## 2022-03-28 ENCOUNTER — Ambulatory Visit: Payer: Medicare (Managed Care) | Admitting: Gastroenterology

## 2022-03-28 NOTE — Progress Notes (Deleted)
GI Office Note    Referring Provider: Ginny Forth* Primary Care Physician:  Yves Dill, NP  Primary Gastroenterologist: ***  Chief Complaint   No chief complaint on file.   History of Present Illness   Danny Fox is a 62 y.o. male presenting today at the request of Nsumanganyi, [place] Ce* for ***screening colonoscopy and blood in the stools.   No prior colonoscopy on file via chart review.  Referred by PCP 02/10/2022.  Patient noted to have history of diabetes with diabetic neuropathy, vitamin D deficiency, HLD, cervicalgia, back pain, long-term use of opiates, PE, and acute DVT, CKD stage II.  Patient reported noticing blood in his stool over 3 months on a intermittent basis.  Patient unsure when he had last colon cancer screening.  Given referral to GI given reports of blood in stool and bloating.  Also advised simethicone chewables 3 times daily as needed.  Labs in September 2023: A1c 6.3, occult blood negative.  LFTs normal.  eGFR 60, creatinine 1.34, glucose 104, hemoglobin 13.8, platelets 266  Today:   Current Outpatient Medications  Medication Sig Dispense Refill   apixaban (ELIQUIS) 5 MG TABS tablet Take 2 tablets (10 mg total) by mouth 2 (two) times daily. 10 tablet 0   aspirin EC 81 MG tablet Take 81 mg by mouth daily as needed (blood thinner).     HYDROcodone-acetaminophen (NORCO/VICODIN) 5-325 MG per tablet Take 1 tablet by mouth every 6 (six) hours as needed for moderate pain. 15 tablet 0   No current facility-administered medications for this visit.    Past Medical History:  Diagnosis Date   Chronic lower back pain    and neck pain   Chronic neck pain    Deep vein thrombosis ([place]) 2012   Right lower extremity venous ultrasound on Jun 25, 2010 showed DVT  involving the popliteal vein below the knee and paraposterior   tibial veins of calf.  Suspicion of chronic DVT as there was a    prominent collateral vein and partial  recannulization of the  popliteal vein and posterior tibial veins    Past Surgical History:  Procedure Laterality Date   CERVICAL FUSION      Family History  Problem Relation Age of Onset   Diabetes Other     Allergies as of 03/28/2022   (No Known Allergies)    Social History   Socioeconomic History   Marital status: Single    Spouse name: Not on file   Number of children: Not on file   Years of education: Not on file   Highest education level: Not on file  Occupational History   Occupation: Unemployed  Tobacco Use   Smoking status: Former    Packs/day: 0.25    Types: Cigarettes    Quit date: 07/05/2015    Years since quitting: 6.7   Smokeless tobacco: Never  Substance and Sexual Activity   Alcohol use: Yes    Alcohol/week: 14.0 standard drinks of alcohol    Types: 14 Standard drinks or equivalent per week    Comment: beer one every 2 days   Drug use: No    Frequency: 1.0 times per week   Sexual activity: Never  Other Topics Concern   Not on file  Social History Narrative   Not on file   Social Determinants of Health   Financial Resource Strain: Not on file  Food Insecurity: Not on file  Transportation Needs: Not on file  Physical Activity:  Not on file  Stress: Not on file  Social Connections: Not on file  Intimate Partner Violence: Not on file     Review of Systems   Gen: Denies any fever, chills, fatigue, weight loss, lack of appetite.  CV: Denies chest pain, heart palpitations, peripheral edema, syncope.  Resp: Denies shortness of breath at rest or with exertion. Denies wheezing or cough.  GI: see HPI GU : Denies urinary burning, urinary frequency, urinary hesitancy MS: Denies joint pain, muscle weakness, cramps, or limitation of movement.  Derm: Denies rash, itching, dry skin Psych: Denies depression, anxiety, memory loss, and confusion Heme: Denies bruising, bleeding, and enlarged lymph nodes.   Physical Exam   There were no vitals taken  for this visit.  General:   Alert and oriented. Pleasant and cooperative. Well-nourished and well-developed.  Head:  Normocephalic and atraumatic. Eyes:  Without icterus, sclera clear and conjunctiva pink.  Ears:  Normal auditory acuity. Mouth:  No deformity or lesions, oral mucosa pink.  Lungs:  Clear to auscultation bilaterally. No wheezes, rales, or rhonchi. No distress.  Heart:  S1, S2 present without murmurs appreciated.  Abdomen:  +BS, soft, non-tender and non-distended. No HSM noted. No guarding or rebound. No masses appreciated.  Rectal:  Deferred  Msk:  Symmetrical without gross deformities. Normal posture. Extremities:  Without edema. Neurologic:  Alert and  oriented x4;  grossly normal neurologically. Skin:  Intact without significant lesions or rashes. Psych:  Alert and cooperative. Normal mood and affect.   Assessment   Danny Fox is a 62 y.o. male with a history of diabetes with diabetic neuropathy, vitamin D deficiency, HLD, cervicalgia, back pain, long-term use of opiates, PE, and acute DVT, CKD stage II presenting today for evaluation prior to scheduling screening colonoscopy.  Screening for colon cancer, rectal bleeding:    PLAN   ***    Venetia Night, MSN, FNP-BC, AGACNP-BC [facility]

## 2022-04-04 ENCOUNTER — Encounter: Payer: Self-pay | Admitting: Gastroenterology

## 2022-04-24 NOTE — Progress Notes (Unsigned)
Referring Provider:*** Primary Care Physician:  Yves Dill, NP Primary Gastroenterologist:  Dr. Rayne Du chief complaint on file.   HPI:   Danny Fox is a 62 y.o. male with history of DVT, PE, chronically anticoagulated on Eliquis***, CKD presenting today at the request of Nsumanganyi, Ferdinand Lango, NP for colon cancer screening.  Recommended office visit due to blood thinner.    Past Medical History:  Diagnosis Date   Chronic lower back pain    and neck pain   Chronic neck pain    Deep vein thrombosis ([place]) 2012   Right lower extremity venous ultrasound on Jun 25, 2010 showed DVT  involving the popliteal vein below the knee and paraposterior   tibial veins of calf.  Suspicion of chronic DVT as there was a    prominent collateral vein and partial recannulization of the  popliteal vein and posterior tibial veins    Past Surgical History:  Procedure Laterality Date   CERVICAL FUSION      Current Outpatient Medications  Medication Sig Dispense Refill   apixaban (ELIQUIS) 5 MG TABS tablet Take 2 tablets (10 mg total) by mouth 2 (two) times daily. 10 tablet 0   aspirin EC 81 MG tablet Take 81 mg by mouth daily as needed (blood thinner).     HYDROcodone-acetaminophen (NORCO/VICODIN) 5-325 MG per tablet Take 1 tablet by mouth every 6 (six) hours as needed for moderate pain. 15 tablet 0   No current facility-administered medications for this visit.    Allergies as of 04/27/2022   (No Known Allergies)    Family History  Problem Relation Age of Onset   Diabetes Other     Social History   Socioeconomic History   Marital status: Single    Spouse name: Not on file   Number of children: Not on file   Years of education: Not on file   Highest education level: Not on file  Occupational History   Occupation: Unemployed  Tobacco Use   Smoking status: Former    Packs/day: 0.25    Types: Cigarettes    Quit date: 07/05/2015    Years since quitting: 6.8    Smokeless tobacco: Never  Substance and Sexual Activity   Alcohol use: Yes    Alcohol/week: 14.0 standard drinks of alcohol    Types: 14 Standard drinks or equivalent per week    Comment: beer one every 2 days   Drug use: No    Frequency: 1.0 times per week   Sexual activity: Never  Other Topics Concern   Not on file  Social History Narrative   Not on file   Social Determinants of Health   Financial Resource Strain: Not on file  Food Insecurity: Not on file  Transportation Needs: Not on file  Physical Activity: Not on file  Stress: Not on file  Social Connections: Not on file  Intimate Partner Violence: Not on file    Review of Systems: Gen: Denies any fever, chills, fatigue, weight loss, lack of appetite.  CV: Denies chest pain, heart palpitations, peripheral edema, syncope.  Resp: Denies shortness of breath at rest or with exertion. Denies wheezing or cough.  GI: Denies dysphagia or odynophagia. Denies jaundice, hematemesis, fecal incontinence. GU : Denies urinary burning, urinary frequency, urinary hesitancy MS: Denies joint pain, muscle weakness, cramps, or limitation of movement.  Derm: Denies rash, itching, dry skin Psych: Denies depression, anxiety, memory loss, and confusion Heme: Denies bruising, bleeding, and enlarged lymph nodes.  Physical Exam: There were no vitals taken for this visit. General:   Alert and oriented. Pleasant and cooperative. Well-nourished and well-developed.  Head:  Normocephalic and atraumatic. Eyes:  Without icterus, sclera clear and conjunctiva pink.  Ears:  Normal auditory acuity. Lungs:  Clear to auscultation bilaterally. No wheezes, rales, or rhonchi. No distress.  Heart:  S1, S2 present without murmurs appreciated.  Abdomen:  +BS, soft, non-tender and non-distended. No HSM noted. No guarding or rebound. No masses appreciated.  Rectal:  Deferred  Msk:  Symmetrical without gross deformities. Normal posture. Extremities:  Without  edema. Neurologic:  Alert and  oriented x4;  grossly normal neurologically. Skin:  Intact without significant lesions or rashes. Psych:  Alert and cooperative. Normal mood and affect.    Assessment:     Plan:  ***   Aliene Altes, PA-C [facility] 04/27/2022

## 2022-04-27 ENCOUNTER — Encounter: Payer: Self-pay | Admitting: Gastroenterology

## 2022-04-27 ENCOUNTER — Telehealth: Payer: Self-pay | Admitting: *Deleted

## 2022-04-27 ENCOUNTER — Ambulatory Visit: Payer: Medicare (Managed Care) | Admitting: Gastroenterology

## 2022-04-27 VITALS — BP 136/78 | HR 93 | Temp 97.6°F | Ht 69.0 in | Wt 249.0 lb

## 2022-04-27 DIAGNOSIS — K5900 Constipation, unspecified: Secondary | ICD-10-CM | POA: Diagnosis not present

## 2022-04-27 DIAGNOSIS — K625 Hemorrhage of anus and rectum: Secondary | ICD-10-CM | POA: Insufficient documentation

## 2022-04-27 NOTE — Telephone Encounter (Signed)
Faxed to PCP

## 2022-04-27 NOTE — Telephone Encounter (Signed)
error 

## 2022-04-27 NOTE — Patient Instructions (Signed)
We will arrange you to have a colonoscopy in the near future with Dr. Gala Romney.  For mild constipation: Start Colace (docusate sodium) 100 mg daily. The goal is for you to have a good productive bowel movement every day.. If 100 mg of Colace daily is not strong enough, you can increase to 200 mg daily. Drink at least 64 ounces of water daily. Consume plenty of fiber through fruits, vegetables, whole grains. Please let me know if you continue to struggle with constipation.  We will follow-up with you in the office after your colonoscopy.  Do not hesitate to call if you have any questions or concerns.  It was very nice to meet you today!  Aliene Altes, PA-C [facility]

## 2022-04-27 NOTE — Telephone Encounter (Signed)
  Request for patient to stop medication prior to procedure or is needing cleareance  04/27/22  Eames T Speltz 07/21/1960  What type of surgery is being performed? Colonoscopy  When is surgery scheduled? TBD  What type of clearance is required (medical or pharmacy to hold medication or both? medication  Are there any medications that need to be held prior to surgery and how long? Eliquis x 2 days  Name of physician performing surgery?  [facility] [employer]: [phone] Fax: [phone]  Anethesia type (none, local, MAC, general)? MAC

## 2022-05-06 NOTE — Telephone Encounter (Signed)
Reviewed. OK given to hold Eliquis x 2 days. Proceed with scheduling.

## 2022-05-06 NOTE — Telephone Encounter (Signed)
Clearance placed on providers desk 

## 2022-05-09 ENCOUNTER — Other Ambulatory Visit: Payer: Self-pay | Admitting: *Deleted

## 2022-05-09 ENCOUNTER — Encounter: Payer: Self-pay | Admitting: *Deleted

## 2022-05-09 MED ORDER — PEG 3350-KCL-NA BICARB-NACL 420 G PO SOLR: 4000.0000 mL | Freq: Once | ORAL | 0 refills | Status: AC

## 2022-05-09 NOTE — Telephone Encounter (Signed)
Pt has been scheduled for 06/13/22, instructions mailed and prep sent to the pharmacy

## 2022-05-24 ENCOUNTER — Emergency Department (HOSPITAL_COMMUNITY)
Admission: EM | Admit: 2022-05-24 | Discharge: 2022-05-24 | Discharge status: Against Medical Advice | Payer: Medicare (Managed Care) | Attending: Emergency Medicine | Admitting: Emergency Medicine

## 2022-05-24 ENCOUNTER — Emergency Department (HOSPITAL_COMMUNITY): Payer: Medicare (Managed Care)

## 2022-05-24 ENCOUNTER — Other Ambulatory Visit: Payer: Self-pay

## 2022-05-24 ENCOUNTER — Encounter (HOSPITAL_COMMUNITY): Payer: Self-pay | Admitting: Emergency Medicine

## 2022-05-24 DIAGNOSIS — G8929 Other chronic pain: Secondary | ICD-10-CM | POA: Diagnosis not present

## 2022-05-24 DIAGNOSIS — R1030 Lower abdominal pain, unspecified: Secondary | ICD-10-CM | POA: Insufficient documentation

## 2022-05-24 DIAGNOSIS — R739 Hyperglycemia, unspecified: Secondary | ICD-10-CM | POA: Insufficient documentation

## 2022-05-24 DIAGNOSIS — M5441 Lumbago with sciatica, right side: Secondary | ICD-10-CM | POA: Diagnosis not present

## 2022-05-24 DIAGNOSIS — Z5329 Procedure and treatment not carried out because of patient's decision for other reasons: Secondary | ICD-10-CM | POA: Insufficient documentation

## 2022-05-24 DIAGNOSIS — N189 Chronic kidney disease, unspecified: Secondary | ICD-10-CM | POA: Diagnosis not present

## 2022-05-24 DIAGNOSIS — Z5321 Procedure and treatment not carried out due to patient leaving prior to being seen by health care provider: Secondary | ICD-10-CM

## 2022-05-24 DIAGNOSIS — Z7901 Long term (current) use of anticoagulants: Secondary | ICD-10-CM | POA: Insufficient documentation

## 2022-05-24 DIAGNOSIS — M545 Low back pain, unspecified: Secondary | ICD-10-CM | POA: Diagnosis present

## 2022-05-24 LAB — CBC WITH DIFFERENTIAL/PLATELET
Abs Immature Granulocytes: 0.01 10*3/uL (ref 0.00–0.07)
Basophils Absolute: 0 10*3/uL (ref 0.0–0.1)
Basophils Relative: 1 %
Eosinophils Absolute: 0.1 10*3/uL (ref 0.0–0.5)
Eosinophils Relative: 2 %
HCT: 43.2 % (ref 39.0–52.0)
Hemoglobin: 14.2 g/dL (ref 13.0–17.0)
Immature Granulocytes: 0 %
Lymphocytes Relative: 47 %
Lymphs Abs: 2.3 10*3/uL (ref 0.7–4.0)
MCH: 30.6 pg (ref 26.0–34.0)
MCHC: 32.9 g/dL (ref 30.0–36.0)
MCV: 93.1 fL (ref 80.0–100.0)
Monocytes Absolute: 0.4 10*3/uL (ref 0.1–1.0)
Monocytes Relative: 8 %
Neutro Abs: 2 10*3/uL (ref 1.7–7.7)
Neutrophils Relative %: 42 %
Platelets: 252 10*3/uL (ref 150–400)
RBC: 4.64 MIL/uL (ref 4.22–5.81)
RDW: 13.7 % (ref 11.5–15.5)
WBC: 4.8 10*3/uL (ref 4.0–10.5)
nRBC: 0 % (ref 0.0–0.2)

## 2022-05-24 LAB — COMPREHENSIVE METABOLIC PANEL
ALT: 15 U/L (ref 0–44)
AST: 18 U/L (ref 15–41)
Albumin: 4.4 g/dL (ref 3.5–5.0)
Alkaline Phosphatase: 75 U/L (ref 38–126)
Anion gap: 7 (ref 5–15)
BUN: 12 mg/dL (ref 8–23)
CO2: 25 mmol/L (ref 22–32)
Calcium: 9.4 mg/dL (ref 8.9–10.3)
Chloride: 104 mmol/L (ref 98–111)
Creatinine, Ser: 1.13 mg/dL (ref 0.61–1.24)
GFR, Estimated: >60 mL/min (ref >60)
Glucose, Bld: 100 mg/dL — ABNORMAL HIGH (ref 70–99)
Potassium: 4.1 mmol/L (ref 3.5–5.1)
Sodium: 136 mmol/L (ref 135–145)
Total Bilirubin: 0.5 mg/dL (ref 0.3–1.2)
Total Protein: 7.9 g/dL (ref 6.5–8.1)

## 2022-05-24 LAB — URINALYSIS, ROUTINE W REFLEX MICROSCOPIC
Bilirubin Urine: NEGATIVE
Glucose, UA: NEGATIVE mg/dL
Hgb urine dipstick: NEGATIVE
Ketones, ur: NEGATIVE mg/dL
Leukocytes,Ua: NEGATIVE
Nitrite: NEGATIVE
Protein, ur: NEGATIVE mg/dL
Specific Gravity, Urine: 1.02 (ref 1.005–1.030)
pH: 5 (ref 5.0–8.0)

## 2022-05-24 LAB — LIPASE, BLOOD: Lipase: 24 U/L (ref 11–51)

## 2022-05-24 MED ORDER — OXYCODONE HCL 5 MG PO TABS
10.0000 mg | ORAL_TABLET | Freq: Once | ORAL | Status: AC
  Administered 2022-05-24: 10 mg via ORAL
  Filled 2022-05-24: qty 2

## 2022-05-24 MED ORDER — IOHEXOL 300 MG/ML  SOLN
100.0000 mL | Freq: Once | INTRAMUSCULAR | Status: AC | PRN
  Administered 2022-05-24: 100 mL via INTRAVENOUS

## 2022-05-24 NOTE — ED Triage Notes (Signed)
Pt c/o right lower back pain radiating into buttocks and flank into right groin x 6 months. Worse lately. Pain with urination as well. Took abx for UTI x 2 months ago. Nad in triage

## 2022-05-24 NOTE — ED Notes (Signed)
Pt agitated stating he is ready to go and wants his IV out. Patient informed that provider was going to be in to speak with him about results. Patient states he is not waiting and wants to leave. Provider notified.

## 2022-05-24 NOTE — ED Provider Notes (Signed)
[place] EMERGENCY DEPARTMENT AT [facility] Provider Note   CSN: [id number] Arrival date & time: 05/24/22  4098     History Chief Complaint  Patient presents with   Back Pain   Flank Pain    Danny Fox is a 62 y.o. male with h/o DVT/PE on Eliquis, chronic back pain, CKD presents the emergency room today for evaluation of lower right-sided back pain that radiates into his lower abdomen for the past 7 months.  Patient reports that this pain is constant and "feels good tooth ache".  He reports that he had some dysuria when this for started however got better after he took antibiotics for a urinary tract infection.  Has not had any dysuria or hematuria since.  He does report that he has some trouble initiating a stream but is constant afterwards.  He denies any fecal or urinary incontinence.  Denies any saddle anesthesia, fevers, IV drug use, chest pain, shortness breath, nausea, vomiting, diarrhea, melena, hematuria, dysuria.  He reports that he has had occasional episodes of hematochezia off and on for the past 7 months which he is already followed up with his primary care doctor and GI specialist and is being scheduled for a colonoscopy soon.  He reports that he takes ibuprofen and oxycodone twice a day for his chronic back pain. He takes a stool softner daily because of his constipation and reports he has a lot of gas pain. He reports that his back pain is never caused his stomach to hurt before, but reports all the symptoms started back in October 2023.NKDA. Denies any tobacco, EtOH use, or illicit drug use.    Back Pain Associated symptoms: abdominal pain   Associated symptoms: no chest pain, no dysuria, no fever, no numbness and no weakness   Flank Pain Associated symptoms include abdominal pain. Pertinent negatives include no chest pain and no shortness of breath.       Home Medications Prior to Admission medications   Medication Sig Start Date End Date Taking?  Authorizing Provider  apixaban (ELIQUIS) 5 MG TABS tablet Take 2 tablets (10 mg total) by mouth 2 (two) times daily. 12/10/14 04/27/22  Oval Linsey, MD  ibuprofen (ADVIL) 800 MG tablet Take 1 tablet by mouth 2 (two) times daily as needed.    [provider]  Oxycodone HCl 20 MG TABS TAKE 1/2 TABLET BY MOUTH EVERY 12 HOURS    [provider]  sildenafil (VIAGRA) 100 MG tablet Take 1 tablet by mouth daily. 04/06/22   [provider]      Allergies    Patient has no known allergies.    Review of Systems   Review of Systems  Constitutional:  Negative for chills and fever.  Respiratory:  Negative for shortness of breath.   Cardiovascular:  Negative for chest pain.  Gastrointestinal:  Positive for abdominal pain and anal bleeding. Negative for abdominal distention, constipation, diarrhea, nausea and vomiting.       Denies any fecal incontinence  Genitourinary:  Negative for dysuria, hematuria, penile discharge, penile pain, penile swelling, scrotal swelling and testicular pain.       Denies any urinary incontinence or urinary retention.  Musculoskeletal:  Positive for back pain.       Denies any saddle anesthesia  Neurological:  Negative for weakness and numbness.    Physical Exam Updated Vital Signs BP 135/84   Pulse (!) 55   Temp 97.8 F (36.6 C) (Oral)   Resp 16  SpO2 97%  Physical Exam Vitals and nursing note reviewed.  Constitutional:      General: He is not in acute distress.    Appearance: Normal appearance. He is not ill-appearing or toxic-appearing.  HENT:     Head: Normocephalic and atraumatic.  Eyes:     General: No scleral icterus. Cardiovascular:     Rate and Rhythm: Normal rate and regular rhythm.  Pulmonary:     Effort: Pulmonary effort is normal. No respiratory distress.  Abdominal:     General: Bowel sounds are normal.     Palpations: Abdomen is soft.     Tenderness: There is abdominal tenderness. There is no guarding or  rebound.     Comments: Mild tenderness to palpation of the lower abdomen. Soft. NBS. No guarding or rebounds noted.   Musculoskeletal:        General: No deformity.     Cervical back: Normal range of motion.     Comments: Diffuse midline and paraspinal lower lumbar tenderness to palpation. No point tenderness. No cervical or thoracic midline tenderness to palpation. Some left paraspinal cervical tenderness to palpation, however he reports this is since his back surgery. Strength is equal in the upper and lower extremities both with and without resistance. Positive straight leg raise on the right. Compartments are soft. Sensation is reportedly intact and symmetric to light touch. Palpable DP and PT pulses bilaterally. Temperature and coloration appears and feels symmetric. Ambulatory.   Skin:    General: Skin is warm and dry.  Neurological:     General: No focal deficit present.     Mental Status: He is alert. Mental status is at baseline.     ED Results / Procedures / Treatments   Labs (all labs ordered are listed, but only abnormal results are displayed) Labs Reviewed  COMPREHENSIVE METABOLIC PANEL - Abnormal; Notable for the following components:      Result Value   Glucose, Bld 100 (*)    All other components within normal limits  URINALYSIS, ROUTINE W REFLEX MICROSCOPIC  CBC WITH DIFFERENTIAL/PLATELET  LIPASE, BLOOD    EKG None  Radiology CT L-SPINE NO CHARGE  Result Date: 05/24/2022 CLINICAL DATA:  Right-sided flank pain since October. EXAM: CT Lumbar Spine with contrast TECHNIQUE: Technique: Multiplanar CT images of the lumbar spine were reconstructed from contemporary CT of the Abdomen and Pelvis. RADIATION DOSE REDUCTION: This exam was performed according to the departmental dose-optimization program which includes automated exposure control, adjustment of the mA and/or kV according to patient size and/or use of iterative reconstruction technique. COMPARISON:  Marked spine MRI  08/19/2016 FINDINGS: Segmentation: Standard; the lowest formed disc space is designated L5-S1. Alignment: Normal. Vertebrae: Vertebral body heights are preserved. There is no evidence of acute fracture. There is no suspicious osseous lesion. A nonaggressive appearing peripherally sclerotic lucent lesion in the right iliac bone was present in 2018. Paraspinal and other soft tissues: The paraspinal soft tissues are unremarkable. The abdominal and pelvic viscera are assessed on the separately dictated CT abdomen/pelvis. Disc levels: There is disc space narrowing with vacuum disc phenomenon at L4-L5. The other disc heights are overall preserved. T12-L1: Mild right worse than left facet arthropathy without significant spinal canal or neural foraminal stenosis. L1-L2: Mild right worse than left facet arthropathy without significant spinal canal or neural foraminal stenosis. L2-L3: There is a disc bulge eccentric to the right and mild right worse than left facet arthropathy resulting in mild bilateral neural foraminal stenosis without significant spinal canal  stenosis, not significantly changed. L3-L4: There is a diffuse disc bulge with a probable inferiorly migrated extrusion extending approximately 8 mm below the level of the disc space, and mild facet arthropathy resulting in mild spinal canal stenosis without convincing evidence of subarticular zone effacement/nerve root impingement, and mild bilateral neural foraminal stenosis. The disc extrusion was not present in 2018. L4-L5: There is a disc bulge, right worse than left endplate spurring, and moderate facet arthropathy resulting in moderate right and mild left neural foraminal stenosis without significant spinal canal stenosis, unchanged L5-S1: There is moderate right and mild left facet arthropathy and mild endplate spurring but no significant spinal canal or neural foraminal stenosis. IMPRESSION: 1. No acute finding in the lumbar spine to explain the patient's  right-sided symptoms. 2. Inferiorly migrated extrusion at L3-L4 appears new since 2018 but without convincing evidence of nerve root impingement. 3. Moderate right and mild left neural foraminal stenosis at L4-L5, not significantly changed. Otherwise, overall mild degenerative changes in the remainder of the lumbar spine as above. Electronically Signed   By: Lesia Hausen M.D.   On: 05/24/2022 12:03   CT ABDOMEN PELVIS W CONTRAST  Result Date: 05/24/2022 CLINICAL DATA:  Right flank pain since October. EXAM: CT ABDOMEN AND PELVIS WITH CONTRAST TECHNIQUE: Multidetector CT imaging of the abdomen and pelvis was performed using the standard protocol following bolus administration of intravenous contrast. RADIATION DOSE REDUCTION: This exam was performed according to the departmental dose-optimization program which includes automated exposure control, adjustment of the mA and/or kV according to patient size and/or use of iterative reconstruction technique. CONTRAST:  OMNIPAQUE IOHEXOL 300 MG/ML  SOLN COMPARISON:  None Available. FINDINGS: Lower chest: The lung bases are clear. A small focus of calcification is noted in the left anterior descending artery. The imaged heart is otherwise unremarkable. Hepatobiliary: The liver and gallbladder are unremarkable. There is no biliary ductal dilatation. Pancreas: Unremarkable. Spleen: Unremarkable. Adrenals/Urinary Tract: The adrenals are unremarkable. The kidneys are unremarkable, with no suspicious lesion, stone, hydronephrosis, or hydroureter. No stone is seen along the course of either ureter. There is symmetric excretion of contrast into the collecting systems on the delayed images. The bladder is unremarkable. Stomach/Bowel: The stomach is unremarkable. There is no evidence of bowel obstruction. There is no abnormal bowel wall thickening or inflammatory change. The appendix is normal. Vascular/Lymphatic: The abdominal aorta is normal in course and caliber with  scattered calcified plaque. There is no abdominal or pelvic lymphadenopathy. Reproductive: The prostate is prominent and impresses upon the inferior aspect of the bladder. Other: There is no ascites or free air. Musculoskeletal: There is a lucent lesion in the right iliac bone with sclerotic margins and no aggressive features, present on the lumbar spine MRI from 2018 and considered benign. The lumbar spine is assessed in full on the separately dictated lumbar spine CT. IMPRESSION: 1. No finding in the abdomen or pelvis to explain the patient's right-sided pain. No renal stones or hydroureteronephrosis. 2. Prominent prostate which impresses upon the inferior aspect of the bladder. 3. The lumbar spine is reported separately. Electronically Signed   By: Lesia Hausen M.D.   On: 05/24/2022 11:55    Procedures Procedures   Medications Ordered in ED Medications  oxyCODONE (Oxy IR/ROXICODONE) immediate release tablet 10 mg (10 mg Oral Given 05/24/22 1133)  iohexol (OMNIPAQUE) 300 MG/ML solution 100 mL (100 mLs Intravenous Contrast Given 05/24/22 1116)    ED Course/ Medical Decision Making/ A&P  Medical Decision Making Amount and/or Complexity of Data Reviewed Labs: ordered. Radiology: ordered.  Risk Prescription drug management.   62 y.o. male presents to the ER for evaluation of back pain and abdominal pain for the past 7 months. Differential diagnosis includes but is not limited to AAA, mesenteric ischemia, appendicitis, diverticulitis, DKA, gastroenteritis, nephrolithiasis, pancreatitis, constipation, UTI, bowel obstruction, biliary disease, IBD, PUD, hepatitis, Fracture (acute/chronic), muscle strain, cauda equina, spinal stenosis, DDD, ligamentous injury, disk herniation, metastatic cancer, vertebral osteomyelitis, kidney stone, pyelonephritis, AAA, pancreatitis, bowel obstruction, meningitis. Vital signs mild bradycardia, otherwise unremarkable. Physical exam as noted  above.   Although this is chronic in nature, will order CT imaging and labs. There are no new changes to his symptoms.   I independently reviewed and interpreted the patient's labs.  CBC without leukocytosis or anemia.  Urinalysis unremarkable.  Lipase within normal limits.  CMP shows mildly elevated glucose at 100 otherwise no other electrolyte or LFT abnormality.  CT abd shows 1. No finding in the abdomen or pelvis to explain the patient's right-sided pain. No renal stones or hydroureteronephrosis. 2. Prominent prostate which impresses upon the inferior aspect of the bladder. 3. The lumbar spine is reported separately.   CT lumbar read as 1. No acute finding in the lumbar spine to explain the patient's right-sided symptoms. 2. Inferiorly migrated extrusion at L3-L4 appears new since 2018 but without convincing evidence of nerve root impingement. 3. Moderate right and mild left neural foraminal stenosis at L4-L5, not significantly changed. Otherwise, overall mild degenerative changes in the remainder of the lumbar spine as above.   Patient eloped.   Final Clinical Impression(s) / ED Diagnoses Final diagnoses:  Eloped from emergency department  Chronic right-sided low back pain with right-sided sciatica  Lower abdominal pain    Rx / DC Orders ED Discharge Orders     None         Achille RichRansom, Judi Jaffe, PA-C 05/27/22 1131    Tanda RockersGray, Samuel A, DO 06/02/22 1538

## 2022-06-13 ENCOUNTER — Encounter (HOSPITAL_COMMUNITY): Payer: Self-pay | Admitting: Internal Medicine

## 2022-06-13 ENCOUNTER — Telehealth: Payer: Self-pay | Admitting: *Deleted

## 2022-06-13 ENCOUNTER — Ambulatory Visit (HOSPITAL_COMMUNITY)
Admission: RE | Admit: 2022-06-13 | Discharge: 2022-06-13 | Disposition: A | Discharge status: Home / Self Care | Payer: Medicare (Managed Care) | Attending: Internal Medicine | Admitting: Internal Medicine

## 2022-06-13 ENCOUNTER — Ambulatory Visit (HOSPITAL_COMMUNITY): Payer: Medicare (Managed Care) | Admitting: Anesthesiology

## 2022-06-13 ENCOUNTER — Other Ambulatory Visit: Payer: Self-pay

## 2022-06-13 ENCOUNTER — Ambulatory Visit (HOSPITAL_BASED_OUTPATIENT_CLINIC_OR_DEPARTMENT_OTHER): Payer: Medicare (Managed Care) | Admitting: Anesthesiology

## 2022-06-13 ENCOUNTER — Encounter (HOSPITAL_COMMUNITY)
Admission: RE | Disposition: A | Discharge status: Home / Self Care | Payer: Self-pay | Source: Home / Self Care | Attending: Internal Medicine

## 2022-06-13 DIAGNOSIS — Z7901 Long term (current) use of anticoagulants: Secondary | ICD-10-CM | POA: Diagnosis not present

## 2022-06-13 DIAGNOSIS — C187 Malignant neoplasm of sigmoid colon: Secondary | ICD-10-CM

## 2022-06-13 DIAGNOSIS — K5730 Diverticulosis of large intestine without perforation or abscess without bleeding: Secondary | ICD-10-CM | POA: Diagnosis not present

## 2022-06-13 DIAGNOSIS — D124 Benign neoplasm of descending colon: Secondary | ICD-10-CM

## 2022-06-13 DIAGNOSIS — N189 Chronic kidney disease, unspecified: Secondary | ICD-10-CM | POA: Diagnosis not present

## 2022-06-13 DIAGNOSIS — Z87891 Personal history of nicotine dependence: Secondary | ICD-10-CM | POA: Insufficient documentation

## 2022-06-13 DIAGNOSIS — C19 Malignant neoplasm of rectosigmoid junction: Secondary | ICD-10-CM | POA: Insufficient documentation

## 2022-06-13 DIAGNOSIS — K921 Melena: Secondary | ICD-10-CM

## 2022-06-13 DIAGNOSIS — K5900 Constipation, unspecified: Secondary | ICD-10-CM | POA: Insufficient documentation

## 2022-06-13 DIAGNOSIS — D128 Benign neoplasm of rectum: Secondary | ICD-10-CM

## 2022-06-13 DIAGNOSIS — K621 Rectal polyp: Secondary | ICD-10-CM | POA: Insufficient documentation

## 2022-06-13 DIAGNOSIS — K635 Polyp of colon: Secondary | ICD-10-CM | POA: Diagnosis not present

## 2022-06-13 HISTORY — PX: POLYPECTOMY: SHX149

## 2022-06-13 HISTORY — PX: SUBMUCOSAL TATTOO INJECTION: SHX6856

## 2022-06-13 HISTORY — PX: BIOPSY: SHX5522

## 2022-06-13 HISTORY — PX: COLONOSCOPY WITH PROPOFOL: SHX5780

## 2022-06-13 LAB — CBC WITH DIFFERENTIAL/PLATELET
Abs Immature Granulocytes: 0.01 10*3/uL (ref 0.00–0.07)
Basophils Absolute: 0 10*3/uL (ref 0.0–0.1)
Basophils Relative: 0 %
Eosinophils Absolute: 0.1 10*3/uL (ref 0.0–0.5)
Eosinophils Relative: 1 %
HCT: 43.7 % (ref 39.0–52.0)
Hemoglobin: 14.2 g/dL (ref 13.0–17.0)
Immature Granulocytes: 0 %
Lymphocytes Relative: 48 %
Lymphs Abs: 2.1 10*3/uL (ref 0.7–4.0)
MCH: 30.5 pg (ref 26.0–34.0)
MCHC: 32.5 g/dL (ref 30.0–36.0)
MCV: 94 fL (ref 80.0–100.0)
Monocytes Absolute: 0.3 10*3/uL (ref 0.1–1.0)
Monocytes Relative: 6 %
Neutro Abs: 2 10*3/uL (ref 1.7–7.7)
Neutrophils Relative %: 45 %
Platelets: 252 10*3/uL (ref 150–400)
RBC: 4.65 MIL/uL (ref 4.22–5.81)
RDW: 13.3 % (ref 11.5–15.5)
WBC: 4.5 10*3/uL (ref 4.0–10.5)
nRBC: 0 % (ref 0.0–0.2)

## 2022-06-13 LAB — COMPREHENSIVE METABOLIC PANEL
ALT: 16 U/L (ref 0–44)
AST: 18 U/L (ref 15–41)
Albumin: 4.3 g/dL (ref 3.5–5.0)
Alkaline Phosphatase: 69 U/L (ref 38–126)
Anion gap: 9 (ref 5–15)
BUN: 14 mg/dL (ref 8–23)
CO2: 26 mmol/L (ref 22–32)
Calcium: 9.3 mg/dL (ref 8.9–10.3)
Chloride: 101 mmol/L (ref 98–111)
Creatinine, Ser: 1.38 mg/dL — ABNORMAL HIGH (ref 0.61–1.24)
GFR, Estimated: 58 mL/min — ABNORMAL LOW (ref >60)
Glucose, Bld: 93 mg/dL (ref 70–99)
Potassium: 3.8 mmol/L (ref 3.5–5.1)
Sodium: 136 mmol/L (ref 135–145)
Total Bilirubin: 0.8 mg/dL (ref 0.3–1.2)
Total Protein: 7.9 g/dL (ref 6.5–8.1)

## 2022-06-13 SURGERY — COLONOSCOPY WITH PROPOFOL
Anesthesia: General

## 2022-06-13 MED ORDER — OXYCODONE HCL 5 MG/5ML PO SOLN: 5.0000 mg | Freq: Once | ORAL | Status: DC | PRN

## 2022-06-13 MED ORDER — LACTATED RINGERS IV SOLN: INTRAVENOUS | Status: DC

## 2022-06-13 MED ORDER — SODIUM CHLORIDE FLUSH 0.9 % IV SOLN
INTRAVENOUS | Status: AC
  Filled 2022-06-13: qty 10

## 2022-06-13 MED ORDER — SPOT INK MARKER SYRINGE KIT
PACK | SUBMUCOSAL | Status: DC | PRN
  Administered 2022-06-13: 10 mL via SUBMUCOSAL

## 2022-06-13 MED ORDER — LIDOCAINE HCL 1 % IJ SOLN
INTRAMUSCULAR | Status: DC | PRN
  Administered 2022-06-13: 50 mg via INTRADERMAL

## 2022-06-13 MED ORDER — PROPOFOL 10 MG/ML IV BOLUS
INTRAVENOUS | Status: DC | PRN
  Administered 2022-06-13: 50 mg via INTRAVENOUS
  Administered 2022-06-13: 120 mg via INTRAVENOUS
  Administered 2022-06-13 (×2): 20 mg via INTRAVENOUS

## 2022-06-13 MED ORDER — PROPOFOL 500 MG/50ML IV EMUL
INTRAVENOUS | Status: DC | PRN
  Administered 2022-06-13: 150175 ug/kg/min via INTRAVENOUS

## 2022-06-13 MED ORDER — OXYCODONE HCL 5 MG PO TABS: 5.0000 mg | ORAL_TABLET | Freq: Once | ORAL | Status: DC | PRN

## 2022-06-13 MED ORDER — FENTANYL CITRATE (PF) 100 MCG/2ML IJ SOLN: 25.0000 ug | INTRAMUSCULAR | Status: DC | PRN

## 2022-06-13 MED ORDER — SPOT INK MARKER SYRINGE KIT
PACK | SUBMUCOSAL | Status: AC
  Filled 2022-06-13: qty 5

## 2022-06-13 MED ORDER — ONDANSETRON HCL 4 MG/2ML IJ SOLN: 4.0000 mg | Freq: Once | INTRAMUSCULAR | Status: DC | PRN

## 2022-06-13 NOTE — Telephone Encounter (Signed)
I have sent referral to [facility].  

## 2022-06-13 NOTE — Anesthesia Postprocedure Evaluation (Signed)
Anesthesia Post Note  Patient: Danny Fox  Procedure(s) Performed: COLONOSCOPY WITH PROPOFOL BIOPSY POLYPECTOMY INTESTINAL SUBMUCOSAL TATTOO INJECTION  Patient location during evaluation: Endoscopy Anesthesia Type: General Level of consciousness: awake and alert Pain management: pain level controlled Vital Signs Assessment: post-procedure vital signs reviewed and stable Respiratory status: spontaneous breathing Cardiovascular status: blood pressure returned to baseline and stable Postop Assessment: no apparent nausea or vomiting Anesthetic complications: no   No notable events documented.   Last Vitals:  Vitals:   06/13/22 1121  BP: (!) 150/90  Pulse: 60  Resp: 19  Temp: 36.8 C  SpO2: 98%    Last Pain:  Vitals:   06/13/22 1226  TempSrc:   PainSc: 0-No pain                 Jarl Sellitto

## 2022-06-13 NOTE — Transfer of Care (Signed)
Immediate Anesthesia Transfer of Care Note  Patient: Danny Fox  Procedure(s) Performed: COLONOSCOPY WITH PROPOFOL BIOPSY POLYPECTOMY INTESTINAL SUBMUCOSAL TATTOO INJECTION  Patient Location: Short Stay  Anesthesia Type:General  Level of Consciousness: awake  Airway & Oxygen Therapy: Patient Spontanous Breathing  Post-op Assessment: Report given to RN  Post vital signs: Reviewed and stable  Last Vitals:  Vitals Value Taken Time  BP    Temp    Pulse    Resp    SpO2      Last Pain:  Vitals:   06/13/22 1226  TempSrc:   PainSc: 0-No pain      Patients Stated Pain Goal: 6 (06/13/22 1121)  Complications: No notable events documented.

## 2022-06-13 NOTE — Discharge Instructions (Addendum)
  Colonoscopy Discharge Instructions  Read the instructions outlined below and refer to this sheet in the next few weeks. These discharge instructions provide you with general information on caring for yourself after you leave the hospital. Your doctor may also give you specific instructions. While your treatment has been planned according to the most current medical practices available, unavoidable complications occasionally occur. If you have any problems or questions after discharge, call Dr. Jena Gauss at [phone]. ACTIVITY You may resume your regular activity, but move at a slower pace for the next 24 hours.  Take frequent rest periods for the next 24 hours.  Walking will help get rid of the air and reduce the bloated feeling in your belly (abdomen).  No driving for 24 hours (because of the medicine (anesthesia) used during the test).   Do not sign any important legal documents or operate any machinery for 24 hours (because of the anesthesia used during the test).  NUTRITION Drink plenty of fluids.  You may resume your normal diet as instructed by your doctor.  Begin with a light meal and progress to your normal diet. Heavy or fried foods are harder to digest and may make you feel sick to your stomach (nauseated).  Avoid alcoholic beverages for 24 hours or as instructed.  MEDICATIONS You may resume your normal medications unless your doctor tells you otherwise.  WHAT YOU CAN EXPECT TODAY Some feelings of bloating in the abdomen.  Passage of more gas than usual.  Spotting of blood in your stool or on the toilet paper.  IF YOU HAD POLYPS REMOVED DURING THE COLONOSCOPY: No aspirin products for 7 days or as instructed.  No alcohol for 7 days or as instructed.  Eat a soft diet for the next 24 hours.  FINDING OUT THE RESULTS OF YOUR TEST Not all test results are available during your visit. If your test results are not back during the visit, make an appointment with your caregiver to find out the  results. Do not assume everything is normal if you have not heard from your caregiver or the medical facility. It is important for you to follow up on all of your test results.  SEEK IMMEDIATE MEDICAL ATTENTION IF: You have more than a spotting of blood in your stool.  Your belly is swollen (abdominal distention).  You are nauseated or vomiting.  You have a temperature over 101.  You have abdominal pain or discomfort that is severe or gets worse throughout the day.     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  This is the cause of your bleeding.  It was biopsied.  You will need to see Dr. Romie Levee in [place] to have this removed.  Before your appointment with Dr. Maisie Fus, we will obtain a CT scan of your abdomen pelvis and chest.   OFFICE WILL CONTACT/CAN CHECK MYCHART   Lab work today Chem-12, CBC, CEA   drawn today and sent to lab   At Patient request I spoke to multiple family members face-to-face regarding today's findings.  Further recommendations to follow.

## 2022-06-13 NOTE — Op Note (Signed)
[facility] Patient Name: Danny Fox Procedure Date: 06/13/2022 11:39 AM MRN: [id number] Date of Birth: 12-12-1960 Attending MD: Gennette Pac , MD, [id number] CSN: [id number] Age: 62 Admit Type: Outpatient Procedure:                Colonoscopy Indications:              Hematochezia Providers:                Gennette Pac, MD, Crystal Page, Durwin Glaze Tech, Technician Referring MD:              Medicines:                Propofol per Anesthesia Complications:            No immediate complications. Estimated Blood Loss:     Estimated blood loss was minimal. Procedure:                Pre-Anesthesia Assessment:                           - Prior to the procedure, a History and Physical                            was performed, and patient medications and                            allergies were reviewed. The patient's tolerance of                            previous anesthesia was also reviewed. The risks                            and benefits of the procedure and the sedation                            options and risks were discussed with the patient.                            All questions were answered, and informed consent                            was obtained. Prior Anticoagulants: The patient                            last took Eliquis (apixaban) 2 days prior to the                            procedure. ASA Grade Assessment: II - A patient                            with mild systemic disease. After reviewing the  risks and benefits, the patient was deemed in                            satisfactory condition to undergo the procedure.                           After obtaining informed consent, the colonoscope                            was passed under direct vision. Throughout the                            procedure, the patient's blood pressure, pulse, and                            oxygen  saturations were monitored continuously. The                            [id number]) scope was introduced through                            the anus and advanced to the the cecum, identified                            by appendiceal orifice and ileocecal valve. The                            colonoscopy was performed without difficulty. The                            patient tolerated the procedure well. The quality                            of the bowel preparation was adequate. The                            ileocecal valve, appendiceal orifice, and rectum                            were photographed. The colonoscopy was performed                            without difficulty. The patient tolerated the                            procedure well. The quality of the bowel                            preparation was adequate. Scope In: 12:31:43 PM Scope Out: 12:56:45 PM Scope Withdrawal Time: 0 hours 18 minutes 51 seconds  Total Procedure Duration: 0 hours 25 minutes 2 seconds  Findings:      The perianal and digital rectal examinations were normal. (4) 5 mm       polyps in the mid rectum. They were sessile.  At approximately 15 cm, I       encountered a semilunar apple core lesion extending from 15 cm to 22 cm.       Please see photos. In the descending segment found (1) 8 mm pedunculated       polyp. The remainder of colonic mucosa appeared normal aside from       scattered small mouth left-sided diverticula; thorough retroflexion view       of the distal rectum revealed no abnormalities; the polyps in the rectum       were cold snare removed. Multiple biopsies of the apple core lesion were       taken for histologic study. This lesion was demarcated below and above       with ink. Removing this segment of the colon with both ink marks in the       specimen would indicate complete resection. 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                             Biopsied and demarcated with ink. Diminutive rectal                            polyps removed. Single 8 mm pedunculated polyp in                            descending segment removed with cold snare and                            submitted separately                           -Few scattered left-sided diverticula; remainder of                            colon appeared normal. No significant hemorrhoids                            found. Moderate Sedation:      Moderate (conscious) sedation was personally administered by an       anesthesia professional. The following parameters were monitored: oxygen       saturation, heart rate, blood pressure, and response to care. Recommendation:           - Patient has a contact number available for                            emergencies. The signs and symptoms of potential                            delayed complications were discussed with the                            patient. Return to normal activities tomorrow.                            Written discharge  instructions were provided to the                            patient.                           - Advance diet as tolerated. Resume Eliquis                            tomorrow. Follow-up on pathology. CT of the abdomen                            pelvis and chest. CEA, CBC and CHEM 12. Anticipate                            surgery and oncology consultation in the near                            future. Procedure Code(s):        --- Professional ---                           [phone], Colonoscopy, flexible; diagnostic, including                            collection of specimen(s) by brushing or washing,                            when performed (separate procedure) Diagnosis Code(s):        --- Professional ---                           K92.1, Melena (includes Hematochezia) CPT copyright 2022 [employer]. All rights reserved. The codes documented in this report are  preliminary and upon coder review may  be revised to meet current compliance requirements. Gerrit Friends. Ileene Allie, MD Gennette Pac, MD 06/13/2022 1:19:22 PM This report has been signed electronically. Number of Addenda: 0

## 2022-06-13 NOTE — H&P (Signed)
@LOGO @   Primary Care Physician:  Kara Pacer, NP Primary Gastroenterologist:  Dr. Jena Gauss  Pre-Procedure History & Physical: HPI:  Danny Fox is a 62 y.o. male here for  diagnostic colonoscopy.  Paper hematochezia in the setting of occasional constipation.  Chronically anticoagulated on Eliquis.  No prior colonoscopy.   Since adding a stool softener and fiber to his regimen bleeding episodes have tapered off.  Past Medical History:  Diagnosis Date   Chronic lower back pain    and neck pain   Chronic neck pain    Deep vein thrombosis (HCC) 2012   Right lower extremity venous ultrasound on Jun 25, 2010 showed DVT  involving the popliteal vein below the knee and paraposterior   tibial veins of calf.  Suspicion of chronic DVT as there was a    prominent collateral vein and partial recannulization of the  popliteal vein and posterior tibial veins    Past Surgical History:  Procedure Laterality Date   CERVICAL FUSION      Prior to Admission medications   Medication Sig Start Date End Date Taking? Authorizing Provider  apixaban (ELIQUIS) 5 MG TABS tablet Take 2 tablets (10 mg total) by mouth 2 (two) times daily. Patient taking differently: Take 5 mg by mouth 2 (two) times daily. 12/10/14 06/09/23 Yes Dondiego, Richard, MD  Camphor-Menthol-Methyl Sal (SALONPAS) 3.02-20-08 % PTCH Place 1 patch onto the skin every 12 (twelve) hours as needed (pain.).   Yes [provider]  ibuprofen (ADVIL) 800 MG tablet Take 800 mg by mouth 2 (two) times daily as needed (pain.).   Yes [provider]  Menthol, Topical Analgesic, (ZIMS MAX-FREEZE EX) Apply 1 Application topically as needed (pain.).   Yes [provider]  Oxycodone HCl 20 MG TABS Take 10 mg by mouth every 12 (twelve) hours.   Yes [provider]  polyethylene glycol-electrolytes (NULYTELY) 420 g solution Take 4,000 mLs by mouth as directed. 05/23/22  Yes [provider]  sildenafil  (VIAGRA) 100 MG tablet Take 100 mg by mouth daily as needed for erectile dysfunction. 04/06/22  Yes [provider]  tamsulosin (FLOMAX) 0.4 MG CAPS capsule Take 0.4 mg by mouth in the morning.   Yes [provider]    Allergies as of 05/09/2022   (No Known Allergies)    Family History  Problem Relation Age of Onset   Diabetes Other    Colon cancer Neg Hx     Social History   Socioeconomic History   Marital status: Single    Spouse name: Not on file   Number of children: Not on file   Years of education: Not on file   Highest education level: Not on file  Occupational History   Occupation: Unemployed  Tobacco Use   Smoking status: Former    Packs/day: .25    Types: Cigarettes    Quit date: 07/05/2015    Years since quitting: 6.9   Smokeless tobacco: Never  Substance and Sexual Activity   Alcohol use: Yes    Alcohol/week: 14.0 standard drinks of alcohol    Types: 14 Standard drinks or equivalent per week    Comment: beer one every 2 days   Drug use: No    Frequency: 1.0 times per week   Sexual activity: Never  Other Topics Concern   Not on file  Social History Narrative   Not on file   Social Determinants of Health   Financial Resource Strain: Not on file  Food Insecurity: Not on file  Transportation Needs: Not on file  Physical Activity: Not on file  Stress: Not on file  Social Connections: Not on file  Intimate Partner Violence: Not on file    Review of Systems: See HPI, otherwise negative ROS  Physical Exam: BP (!) 150/90   Pulse 60   Temp 98.3 F (36.8 C) (Oral)   Resp 19   Ht 5\' 9"  (1.753 m)   Wt 107.5 kg   SpO2 98%   BMI 35.00 kg/m  General:   Alert,  Well-developed, well-nourished, pleasant and cooperative in NAD Neck:  Supple; no masses or thyromegaly. No significant cervical adenopathy. Lungs:  Clear throughout to auscultation.   No wheezes, crackles, or rhonchi. No acute distress. Heart:  Regular rate and rhythm; no  murmurs, clicks, rubs,  or gallops. Abdomen: Non-distended, normal bowel sounds.  Soft and nontender without appreciable mass or hepatosplenomegaly.    Impression/Plan:    62 year old gentleman with paper hematochezia in setting of chronic anticoagulation.  Some constipation.  Since addition of fiber supplement and stool softener bleeding is tapered off.  He is here for for his first ever colonoscopy for diagnostic purpose.  He held the Eliquis 2 days ago per plan.  I have offered him a diagnostic colonoscopy today. The risks, benefits, limitations, alternatives and imponderables have been reviewed with the patient. Questions have been answered. All parties are agreeable.       Notice: This dictation was prepared with Dragon dictation along with smaller phrase technology. Any transcriptional errors that result from this process are unintentional and may not be corrected upon review.

## 2022-06-13 NOTE — Anesthesia Preprocedure Evaluation (Signed)
Anesthesia Evaluation  Patient identified by MRN, date of birth, ID band Patient awake    Reviewed: Allergy & Precautions, H&P , NPO status , Patient's Chart, lab work & pertinent test results, reviewed documented beta blocker date and time   Airway Mallampati: II  TM Distance: >3 FB Neck ROM: full    Dental no notable dental hx.    Pulmonary neg pulmonary ROS, former smoker   Pulmonary exam normal breath sounds clear to auscultation       Cardiovascular Exercise Tolerance: Good negative cardio ROS  Rhythm:regular Rate:Normal     Neuro/Psych negative neurological ROS  negative psych ROS   GI/Hepatic negative GI ROS, Neg liver ROS,,,  Endo/Other  negative endocrine ROS    Renal/GU CRFRenal diseasenegative Renal ROS  negative genitourinary   Musculoskeletal   Abdominal   Peds  Hematology negative hematology ROS (+)   Anesthesia Other Findings   Reproductive/Obstetrics negative OB ROS                             Anesthesia Physical Anesthesia Plan  ASA: 3  Anesthesia Plan: General   Post-op Pain Management:    Induction:   PONV Risk Score and Plan: Propofol infusion  Airway Management Planned:   Additional Equipment:   Intra-op Plan:   Post-operative Plan:   Informed Consent: I have reviewed the patients History and Physical, chart, labs and discussed the procedure including the risks, benefits and alternatives for the proposed anesthesia with the patient or authorized representative who has indicated his/her understanding and acceptance.     Dental Advisory Given  Plan Discussed with: CRNA  Anesthesia Plan Comments:        Anesthesia Quick Evaluation

## 2022-06-13 NOTE — Telephone Encounter (Signed)
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  Lab work and scans are being ordered.  I would like Dr. Romie Levee to see patient in the near future regarding resection

## 2022-06-14 LAB — CEA: CEA: 4.5 ng/mL (ref 0.0–4.7)

## 2022-06-14 NOTE — Telephone Encounter (Signed)
Since pt had CT A/P earlier this month Dr. Jena Gauss requests ASAP CT CHEST for staging purposes. PA submitted via [facility]. Pending review  Tracking: [id number]

## 2022-06-16 ENCOUNTER — Encounter (HOSPITAL_COMMUNITY): Payer: Self-pay | Admitting: Internal Medicine

## 2022-06-16 LAB — SURGICAL PATHOLOGY

## 2022-06-16 NOTE — Telephone Encounter (Signed)
Checked PA on [facility] and still pending review

## 2022-06-17 NOTE — Telephone Encounter (Signed)
Checked [facility] PA still pending

## 2022-06-20 NOTE — Addendum Note (Signed)
Addended by: Armstead Peaks on: 06/20/2022 08:25 AM   Modules accepted: Orders

## 2022-06-20 NOTE — Telephone Encounter (Signed)
PA approved. ADOS: 06/14/2022-09/12/2022, auth# : [id number]

## 2022-06-20 NOTE — Telephone Encounter (Signed)
CT CHEST scheduled for 5/8, arrival 2:30pm, Per Melissa at central scheduling he will not have to drink oral contrast Called pt and he is aware of appt details

## 2022-06-20 NOTE — Telephone Encounter (Signed)
Kylee from [facility] called regarding PA for CT. Gave her the pa number and dos.

## 2022-06-22 ENCOUNTER — Ambulatory Visit (HOSPITAL_COMMUNITY)
Admission: RE | Admit: 2022-06-22 | Discharge: 2022-06-22 | Disposition: A | Discharge status: Home / Self Care | Payer: Medicare (Managed Care) | Source: Ambulatory Visit | Attending: Internal Medicine | Admitting: Internal Medicine

## 2022-06-22 DIAGNOSIS — C19 Malignant neoplasm of rectosigmoid junction: Secondary | ICD-10-CM | POA: Diagnosis not present

## 2022-06-22 MED ORDER — IOHEXOL 300 MG/ML  SOLN
75.0000 mL | Freq: Once | INTRAMUSCULAR | Status: AC | PRN
  Administered 2022-06-22: 75 mL via INTRAVENOUS

## 2022-06-28 ENCOUNTER — Ambulatory Visit: Payer: Self-pay | Admitting: General Surgery

## 2022-06-28 NOTE — H&P (View-Only) (Signed)
 REFERRING PHYSICIAN:  Rourk, Robert M, MD  PROVIDER:  Ryen Heitmeyer CHRISTINE Renton Berkley, MD  MRN: [id number] DOB: 01/22/1961 DATE OF ENCOUNTER: 06/28/2022  Subjective   Chief Complaint: New Consultation ( Rectosigmoid cancer/)     History of Present Illness: Danny Fox is a 62 y.o. male who is seen today as an office consultation at the request of Dr. Rourk for evaluation of New Consultation ( Rectosigmoid cancer/) .  Patient underwent a colonoscopy on June 13, 2022.  This showed several polyps in the rectum as well as an apple core lesion approximately 15 to 22 cm proximal to the anal verge.  There was another polyp found in the descending colon.  The large mass was tattooed proximally and distally.  CT scan of the chest, abd and pelvis showed no sign of metastatic disease.  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  CEA was normal.  Patient has a history of chronic back and neck pain as well as DVT.   Review of Systems: A complete review of systems was obtained from the patient.  I have reviewed this information and discussed as appropriate with the patient.  See HPI as well for other ROS.   Medical History: Past Medical History:  Diagnosis Date   DVT (deep venous thrombosis) (CMS/HHS-HCC)     There is no problem list on file for this patient.   Past Surgical History:  Procedure Laterality Date   Back Surgery     1999     No Known Allergies  Current Outpatient Medications on File Prior to Visit  Medication Sig Dispense Refill   apixaban (ELIQUIS) 5 mg tablet Take 10 mg by mouth 2 (two) times daily     ibuprofen (MOTRIN) 800 MG tablet Take by mouth     oxyCODONE (OXYIR) 20 mg immediate release tablet Take 10 mg by mouth every 12 (twelve) hours     sildenafiL (VIAGRA) 100 MG tablet Take 100 mg by mouth once daily     tamsulosin (FLOMAX) 0.4 mg capsule Take by mouth     No current facility-administered medications on file prior to visit.     Family History  Problem Relation Age of Onset   Diabetes Mother    Diabetes Brother      Social History   Tobacco Use  Smoking Status Never  Smokeless Tobacco Never     Social History   Socioeconomic History   Marital status: Single  Tobacco Use   Smoking status: Never   Smokeless tobacco: Never  Vaping Use   Vaping status: Never Used  Substance and Sexual Activity   Alcohol use: Not Currently   Drug use: Never    Objective:    Vitals:   06/28/22 0917  BP: 137/82  Pulse: 94  Temp: 36.5 C (97.7 F)  SpO2: 98%  Weight: (!) 110.9 kg (244 lb 9.6 oz)  Height: 175.3 cm (5' 9")  PainSc:   8     Exam Gen: NAD Abd: soft    Labs, Imaging and Diagnostic Testing: CT scan of the chest abdomen and pelvis reviewed.  There does appears to be some thickening in the sigmoid colon distally. Colonoscopy and pathology reports reviewed.  Assessment and Plan:  Diagnoses and all orders for this visit:  Malignant neoplasm of sigmoid colon (CMS/HHS-HCC)    Patient appears to have a distal sigmoid mass.  He is having some issues with what sounds like obstructive constipation.  He is using laxatives for this currently.  I   have recommended proceeding with a robotic assisted partial colectomy.  We have discussed this in detail.  All questions were answered. The surgery and anatomy were described to the patient as well as the risks of surgery and the possible complications.  These include: Bleeding, deep abdominal infections and possible wound complications such as hernia and infection, damage to adjacent structures, leak of surgical connections, which can lead to other surgeries and possibly an ostomy, possible need for other procedures, such as abscess drains in radiology, possible prolonged hospital stay, possible diarrhea from removal of part of the colon, possible constipation from narcotics, possible bowel, bladder or sexual dysfunction if having rectal surgery, prolonged  fatigue/weakness or appetite loss, possible early recurrence of of disease, possible complications of their medical problems such as heart disease or arrhythmias or lung problems, death (less than 1%). I believe the patient understands and wishes to proceed with the surgery.     Tasheem Elms C Dequincy Born, MD Colon and Rectal Surgery [facility] 

## 2022-06-28 NOTE — H&P (Signed)
REFERRING PHYSICIAN:  Corbin Ade, MD  PROVIDER:  Elenora Gamma, MD  MRN: [id number] DOB: 10-04-1960 DATE OF ENCOUNTER: 06/28/2022  Subjective   Chief Complaint: New Consultation ( Rectosigmoid cancer/)     History of Present Illness: Danny Fox is a 62 y.o. male who is seen today as an office consultation at the request of Dr. Jena Gauss for evaluation of New Consultation ( Rectosigmoid cancer/) .  Patient underwent a colonoscopy on June 13, 2022.  This showed several polyps in the rectum as well as an apple core lesion approximately 15 to 22 cm proximal to the anal verge.  There was another polyp found in the descending colon.  The large mass was tattooed proximally and distally.  CT scan of the chest, abd and pelvis showed no sign of metastatic disease.  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  CEA was normal.  Patient has a history of chronic back and neck pain as well as DVT.   Review of Systems: A complete review of systems was obtained from the patient.  I have reviewed this information and discussed as appropriate with the patient.  See HPI as well for other ROS.   Medical History: Past Medical History:  Diagnosis Date   DVT (deep venous thrombosis) (CMS/HHS-HCC)     There is no problem list on file for this patient.   Past Surgical History:  Procedure Laterality Date   Back Surgery     1999     No Known Allergies  Current Outpatient Medications on File Prior to Visit  Medication Sig Dispense Refill   apixaban (ELIQUIS) 5 mg tablet Take 10 mg by mouth 2 (two) times daily     ibuprofen (MOTRIN) 800 MG tablet Take by mouth     oxyCODONE (OXYIR) 20 mg immediate release tablet Take 10 mg by mouth every 12 (twelve) hours     sildenafiL (VIAGRA) 100 MG tablet Take 100 mg by mouth once daily     tamsulosin (FLOMAX) 0.4 mg capsule Take by mouth     No current facility-administered medications on file prior to visit.     Family History  Problem Relation Age of Onset   Diabetes Mother    Diabetes Brother      Social History   Tobacco Use  Smoking Status Never  Smokeless Tobacco Never     Social History   Socioeconomic History   Marital status: Single  Tobacco Use   Smoking status: Never   Smokeless tobacco: Never  Vaping Use   Vaping status: Never Used  Substance and Sexual Activity   Alcohol use: Not Currently   Drug use: Never    Objective:    Vitals:   06/28/22 0917  BP: 137/82  Pulse: 94  Temp: 36.5 C (97.7 F)  SpO2: 98%  Weight: (!) 110.9 kg (244 lb 9.6 oz)  Height: 175.3 cm (5\' 9" )  PainSc:   8     Exam Gen: NAD Abd: soft    Labs, Imaging and Diagnostic Testing: CT scan of the chest abdomen and pelvis reviewed.  There does appears to be some thickening in the sigmoid colon distally. Colonoscopy and pathology reports reviewed.  Assessment and Plan:  Diagnoses and all orders for this visit:  Malignant neoplasm of sigmoid colon (CMS/HHS-HCC)    Patient appears to have a distal sigmoid mass.  He is having some issues with what sounds like obstructive constipation.  He is using laxatives for this currently.  I  have recommended proceeding with a robotic assisted partial colectomy.  We have discussed this in detail.  All questions were answered. The surgery and anatomy were described to the patient as well as the risks of surgery and the possible complications.  These include: Bleeding, deep abdominal infections and possible wound complications such as hernia and infection, damage to adjacent structures, leak of surgical connections, which can lead to other surgeries and possibly an ostomy, possible need for other procedures, such as abscess drains in radiology, possible prolonged hospital stay, possible diarrhea from removal of part of the colon, possible constipation from narcotics, possible bowel, bladder or sexual dysfunction if having rectal surgery, prolonged  fatigue/weakness or appetite loss, possible early recurrence of of disease, possible complications of their medical problems such as heart disease or arrhythmias or lung problems, death (less than 1%). I believe the patient understands and wishes to proceed with the surgery.     Vanita Panda, MD Colon and Rectal Surgery [facility]

## 2022-06-29 ENCOUNTER — Telehealth: Payer: Self-pay

## 2022-06-29 NOTE — Telephone Encounter (Signed)
Consult note from [facility] is scanned under media for this patient.

## 2022-07-04 NOTE — Progress Notes (Addendum)
COVID Vaccine Completed:  Date of COVID positive in last 90 days:  PCP - Vara Guardian, NP  Cardiologist - Valera Castle, MD (last OV 2014 for PE)  Chest x-ray - CT chest 06-22-22 Epic EKG - 07-05-22 Epic Stress Test -  ECHO - 12-09-14 Epic Cardiac Cath -  Pacemaker/ICD device last checked: Spinal Cord Stimulator:  Bowel Prep -   Sleep Study -  CPAP -   Fasting Blood Sugar -  Checks Blood Sugar _____ times a day  Last dose of GLP1 agonist-  N/A GLP1 instructions:  N/A   Last dose of SGLT-2 inhibitors-  N/A SGLT-2 instructions: N/A   Blood Thinner Instructions:  Eliquis Time Aspirin Instructions: Last Dose:  Activity level:  Can go up a flight of stairs and perform activities of daily living without stopping and without symptoms of chest pain or shortness of breath.  Able to exercise without symptoms  Unable to go up a flight of stairs without symptoms of     Anesthesia review: RBBB, hx of PE  Patient denies shortness of breath, fever, cough and chest pain at PAT appointment  Patient verbalized understanding of instructions that were given to them at the PAT appointment. Patient was also instructed that they will need to review over the PAT instructions again at home before surgery.

## 2022-07-04 NOTE — Patient Instructions (Addendum)
SURGICAL WAITING ROOM VISITATION Patients having surgery or a procedure may have no more than 2 support people in the waiting area - these visitors may rotate.    Children under the age of 32 must have an adult with them who is not the patient.  If the patient needs to stay at the hospital during part of their recovery, the visitor guidelines for inpatient rooms apply. Pre-op nurse will coordinate an appropriate time for 1 support person to accompany patient in pre-op.  This support person may not rotate.    Please refer to the [facility] website for the visitor guidelines for Inpatients (after your surgery is over and you are in a regular room).       Your procedure is scheduled on: 07-07-22   Report to [facility] Main Entrance    Report to admitting at 8:45 AM   Call this number if you have problems the morning of surgery [phone]   Follow a clear liquid diet day of prep to prevent dehydration   Do not eat food :After Midnight.   After Midnight you may have the following liquids until 8:00 AM DAY OF SURGERY  Water Non-Citrus Juices (without pulp, NO RED-Apple, White grape, White cranberry) Black Coffee (NO MILK/CREAM OR CREAMERS, sugar ok)  Clear Tea (NO MILK/CREAM OR CREAMERS, sugar ok) regular and decaf                             Plain Jell-O (NO RED)                                           Fruit ices (not with fruit pulp, NO RED)                                     Popsicles (NO RED)                                                               Sports drinks like Gatorade (NO RED)              Drink 2 Pre-Surgery Ensure the evening before surgery (have completed by 10 PM)      The day of surgery:  Drink ONE (1) Pre-Surgery Clear Ensure at 8:00 AM the morning of surgery. Drink in one sitting. Do not sip.  This drink was given to you during your hospital  pre-op appointment visit. Nothing else to drink after completing the Pre-Surgery Clear Ensure.           If you have questions, please contact your surgeon's office.   FOLLOW BOWEL PREP AND ANY ADDITIONAL PRE OP INSTRUCTIONS YOU RECEIVED FROM YOUR SURGEON'S OFFICE!!!     Oral Hygiene is also important to reduce your risk of infection.                                    Remember - BRUSH YOUR TEETH THE MORNING OF SURGERY WITH YOUR REGULAR TOOTHPASTE  Do NOT smoke after Midnight   Take these medicines the morning of surgery with A SIP OF WATER:  Tamsulosin                             You may not have any metal on your body including jewelry, and body piercing             Do not wear  lotions, powders, cologne, or deodorant              Men may shave face and neck.   Do not bring valuables to the hospital. [name] IS NOT RESPONSIBLE   FOR VALUABLES.   Contacts, dentures or bridgework may not be worn into surgery.   Bring small overnight bag day of surgery.   DO NOT BRING YOUR HOME MEDICATIONS TO THE HOSPITAL. PHARMACY WILL DISPENSE MEDICATIONS LISTED ON YOUR MEDICATION LIST TO YOU DURING YOUR ADMISSION IN THE HOSPITAL!     Special Instructions: Bring a copy of your healthcare power of attorney and living will documents the day of surgery if you haven't scanned them before.              Please read over the following fact sheets you were given: IF YOU HAVE QUESTIONS ABOUT YOUR PRE-OP INSTRUCTIONS PLEASE CALL [phone] [name]  If you received a COVID test during your pre-op visit  it is requested that you wear a mask when out in public, stay away from anyone that may not be feeling well and notify your surgeon if you develop symptoms. If you test positive for Covid or have been in contact with anyone that has tested positive in the last 10 days please notify you surgeon.  [name] - Preparing for Surgery Before surgery, you can play an important role.  Because skin is not sterile, your skin needs to be as free of germs as possible.  You can reduce the number of germs on your  skin by washing with CHG (chlorahexidine gluconate) soap before surgery.  CHG is an antiseptic cleaner which kills germs and bonds with the skin to continue killing germs even after washing. Please DO NOT use if you have an allergy to CHG or antibacterial soaps.  If your skin becomes reddened/irritated stop using the CHG and inform your nurse when you arrive at Short Stay. Do not shave (including legs and underarms) for at least 48 hours prior to the first CHG shower.  You may shave your face/neck.  Please follow these instructions carefully:  1.  Shower with CHG Soap the night before surgery and the  morning of surgery.  2.  If you choose to wash your hair, wash your hair first as usual with your normal  shampoo.  3.  After you shampoo, rinse your hair and body thoroughly to remove the shampoo.                             4.  Use CHG as you would any other liquid soap.  You can apply chg directly to the skin and wash.  Gently with a scrungie or clean washcloth.  5.  Apply the CHG Soap to your body ONLY FROM THE NECK DOWN.   Do   not use on face/ open  Wound or open sores. Avoid contact with eyes, ears mouth and   genitals (private parts).                       Wash face,  Genitals (private parts) with your normal soap.             6.  Wash thoroughly, paying special attention to the area where your    surgery  will be performed.  7.  Thoroughly rinse your body with warm water from the neck down.  8.  DO NOT shower/wash with your normal soap after using and rinsing off the CHG Soap.                9.  Pat yourself dry with a clean towel.            10.  Wear clean pajamas.            11.  Place clean sheets on your bed the night of your first shower and do not  sleep with pets. Day of Surgery : Do not apply any lotions/deodorants the morning of surgery.  Please wear clean clothes to the hospital/surgery center.  FAILURE TO FOLLOW THESE INSTRUCTIONS MAY RESULT IN THE  CANCELLATION OF YOUR SURGERY  PATIENT SIGNATURE_________________________________  NURSE SIGNATURE__________________________________  ________________________________________________________________________   WHAT IS A BLOOD TRANSFUSION? Blood Transfusion Information  A transfusion is the replacement of blood or some of its parts. Blood is made up of multiple cells which provide different functions. Red blood cells carry oxygen and are used for blood loss replacement. White blood cells fight against infection. Platelets control bleeding. Plasma helps clot blood. Other blood products are available for specialized needs, such as hemophilia or other clotting disorders. BEFORE THE TRANSFUSION  Who gives blood for transfusions?  Healthy volunteers who are fully evaluated to make sure their blood is safe. This is blood bank blood. Transfusion therapy is the safest it has ever been in the practice of medicine. Before blood is taken from a donor, a complete history is taken to make sure that person has no history of diseases nor engages in risky social behavior (examples are intravenous drug use or sexual activity with multiple partners). The donor's travel history is screened to minimize risk of transmitting infections, such as malaria. The donated blood is tested for signs of infectious diseases, such as HIV and hepatitis. The blood is then tested to be sure it is compatible with you in order to minimize the chance of a transfusion reaction. If you or a relative donates blood, this is often done in anticipation of surgery and is not appropriate for emergency situations. It takes many days to process the donated blood. RISKS AND COMPLICATIONS Although transfusion therapy is very safe and saves many lives, the main dangers of transfusion include:  Getting an infectious disease. Developing a transfusion reaction. This is an allergic reaction to something in the blood you were given. Every precaution is  taken to prevent this. The decision to have a blood transfusion has been considered carefully by your caregiver before blood is given. Blood is not given unless the benefits outweigh the risks. AFTER THE TRANSFUSION Right after receiving a blood transfusion, you will usually feel much better and more energetic. This is especially true if your red blood cells have gotten low (anemic). The transfusion raises the level of the red blood cells which carry oxygen, and this usually causes an energy increase. The nurse administering the  transfusion will monitor you carefully for complications. HOME CARE INSTRUCTIONS  No special instructions are needed after a transfusion. You may find your energy is better. Speak with your caregiver about any limitations on activity for underlying diseases you may have. SEEK MEDICAL CARE IF:  Your condition is not improving after your transfusion. You develop redness or irritation at the intravenous (IV) site. SEEK IMMEDIATE MEDICAL CARE IF:  Any of the following symptoms occur over the next 12 hours: Shaking chills. You have a temperature by mouth above 102 F (38.9 C), not controlled by medicine. Chest, back, or muscle pain. People around you feel you are not acting correctly or are confused. Shortness of breath or difficulty breathing. Dizziness and fainting. You get a rash or develop hives. You have a decrease in urine output. Your urine turns a dark color or changes to pink, red, or brown. Any of the following symptoms occur over the next 10 days: You have a temperature by mouth above 102 F (38.9 C), not controlled by medicine. Shortness of breath. Weakness after normal activity. The white part of the eye turns yellow (jaundice). You have a decrease in the amount of urine or are urinating less often. Your urine turns a dark color or changes to pink, red, or brown. Document Released: 01/29/2000 Document Revised: 04/25/2011 Document Reviewed:  09/17/2007 [facility] Patient Information 2014 [place], [place].  _______________________________________________________________________

## 2022-07-05 ENCOUNTER — Encounter (HOSPITAL_COMMUNITY)
Admission: RE | Admit: 2022-07-05 | Discharge: 2022-07-05 | Disposition: A | Discharge status: Home / Self Care | Payer: Medicare (Managed Care) | Source: Ambulatory Visit | Attending: General Surgery | Admitting: General Surgery

## 2022-07-05 ENCOUNTER — Other Ambulatory Visit (HOSPITAL_COMMUNITY): Payer: Medicare (Managed Care)

## 2022-07-05 DIAGNOSIS — Z01812 Encounter for preprocedural laboratory examination: Secondary | ICD-10-CM | POA: Insufficient documentation

## 2022-07-05 DIAGNOSIS — Z01818 Encounter for other preprocedural examination: Secondary | ICD-10-CM

## 2022-07-05 LAB — CBC
HCT: 41.1 % (ref 39.0–52.0)
Hemoglobin: 13.4 g/dL (ref 13.0–17.0)
MCH: 30.5 pg (ref 26.0–34.0)
MCHC: 32.6 g/dL (ref 30.0–36.0)
MCV: 93.4 fL (ref 80.0–100.0)
Platelets: 249 10*3/uL (ref 150–400)
RBC: 4.4 MIL/uL (ref 4.22–5.81)
RDW: 12.8 % (ref 11.5–15.5)
WBC: 4.2 10*3/uL (ref 4.0–10.5)
nRBC: 0 % (ref 0.0–0.2)

## 2022-07-06 ENCOUNTER — Other Ambulatory Visit: Payer: Self-pay

## 2022-07-06 ENCOUNTER — Encounter (HOSPITAL_COMMUNITY)
Admission: RE | Admit: 2022-07-06 | Discharge: 2022-07-06 | Disposition: A | Discharge status: Home / Self Care | Payer: Medicare (Managed Care) | Source: Ambulatory Visit | Attending: General Surgery | Admitting: General Surgery

## 2022-07-06 ENCOUNTER — Encounter (HOSPITAL_COMMUNITY): Payer: Self-pay

## 2022-07-06 DIAGNOSIS — Z01818 Encounter for other preprocedural examination: Secondary | ICD-10-CM

## 2022-07-06 HISTORY — DX: Malignant neoplasm of colon, unspecified: C18.9

## 2022-07-06 MED ORDER — INDOCYANINE GREEN 25 MG IV SOLR
INTRAVENOUS | Status: AC
  Filled 2022-07-06: qty 10

## 2022-07-06 NOTE — Progress Notes (Signed)
Contact patient to see if he could arrive at 0745 for surgery on 5/23.  Patient stated that his brother is bring him and he will probably be here closer to his original time.  I talk patient that it ok and that we will get him ready as soon as he arrives

## 2022-07-06 NOTE — Progress Notes (Signed)
Spoke to [place] at Dr. Joya San' office to make her aware that patient accidentally took a dose of Eliquis on 07-04-22 at 0800.  Toniann Fail stated that she would make Dr. Maisie Fus aware.

## 2022-07-07 ENCOUNTER — Inpatient Hospital Stay (HOSPITAL_COMMUNITY): Payer: Medicare (Managed Care) | Admitting: Anesthesiology

## 2022-07-07 ENCOUNTER — Encounter (HOSPITAL_COMMUNITY)
Admission: RE | Disposition: A | Discharge status: Home / Self Care | Payer: Self-pay | Source: Home / Self Care | Attending: General Surgery

## 2022-07-07 ENCOUNTER — Other Ambulatory Visit: Payer: Self-pay

## 2022-07-07 ENCOUNTER — Encounter (HOSPITAL_COMMUNITY): Payer: Self-pay | Admitting: General Surgery

## 2022-07-07 ENCOUNTER — Inpatient Hospital Stay (HOSPITAL_COMMUNITY)
Admission: RE | Admit: 2022-07-07 | Discharge: 2022-07-09 | DRG: 330 | Disposition: A | Discharge status: Home / Self Care | Payer: Medicare (Managed Care) | Attending: General Surgery | Admitting: General Surgery

## 2022-07-07 ENCOUNTER — Inpatient Hospital Stay (HOSPITAL_COMMUNITY): Payer: Medicare (Managed Care) | Admitting: Physician Assistant

## 2022-07-07 DIAGNOSIS — Z833 Family history of diabetes mellitus: Secondary | ICD-10-CM | POA: Diagnosis not present

## 2022-07-07 DIAGNOSIS — K660 Peritoneal adhesions (postprocedural) (postinfection): Secondary | ICD-10-CM | POA: Diagnosis present

## 2022-07-07 DIAGNOSIS — Z01812 Encounter for preprocedural laboratory examination: Secondary | ICD-10-CM | POA: Diagnosis not present

## 2022-07-07 DIAGNOSIS — Z86718 Personal history of other venous thrombosis and embolism: Secondary | ICD-10-CM | POA: Diagnosis not present

## 2022-07-07 DIAGNOSIS — M542 Cervicalgia: Secondary | ICD-10-CM | POA: Diagnosis present

## 2022-07-07 DIAGNOSIS — C189 Malignant neoplasm of colon, unspecified: Secondary | ICD-10-CM

## 2022-07-07 DIAGNOSIS — C187 Malignant neoplasm of sigmoid colon: Principal | ICD-10-CM | POA: Diagnosis present

## 2022-07-07 DIAGNOSIS — N182 Chronic kidney disease, stage 2 (mild): Secondary | ICD-10-CM | POA: Diagnosis present

## 2022-07-07 DIAGNOSIS — Z7901 Long term (current) use of anticoagulants: Secondary | ICD-10-CM | POA: Diagnosis not present

## 2022-07-07 DIAGNOSIS — C19 Malignant neoplasm of rectosigmoid junction: Principal | ICD-10-CM | POA: Diagnosis present

## 2022-07-07 DIAGNOSIS — M549 Dorsalgia, unspecified: Secondary | ICD-10-CM | POA: Diagnosis present

## 2022-07-07 DIAGNOSIS — Z6835 Body mass index (BMI) 35.0-35.9, adult: Secondary | ICD-10-CM

## 2022-07-07 DIAGNOSIS — I82409 Acute embolism and thrombosis of unspecified deep veins of unspecified lower extremity: Secondary | ICD-10-CM | POA: Diagnosis not present

## 2022-07-07 DIAGNOSIS — Z86711 Personal history of pulmonary embolism: Secondary | ICD-10-CM | POA: Diagnosis not present

## 2022-07-07 DIAGNOSIS — C7989 Secondary malignant neoplasm of other specified sites: Secondary | ICD-10-CM | POA: Diagnosis present

## 2022-07-07 DIAGNOSIS — Z79899 Other long term (current) drug therapy: Secondary | ICD-10-CM

## 2022-07-07 DIAGNOSIS — G8929 Other chronic pain: Secondary | ICD-10-CM | POA: Diagnosis present

## 2022-07-07 LAB — TYPE AND SCREEN
ABO/RH(D): A POS
Antibody Screen: NEGATIVE

## 2022-07-07 LAB — ABO/RH: ABO/RH(D): A POS

## 2022-07-07 SURGERY — COLECTOMY, PARTIAL, ROBOT-ASSISTED, LAPAROSCOPIC
Anesthesia: General

## 2022-07-07 MED ORDER — DIPHENHYDRAMINE HCL 12.5 MG/5ML PO ELIX: 12.5000 mg | ORAL_SOLUTION | Freq: Four times a day (QID) | ORAL | Status: DC | PRN

## 2022-07-07 MED ORDER — EPHEDRINE SULFATE-NACL 50-0.9 MG/10ML-% IV SOSY
PREFILLED_SYRINGE | INTRAVENOUS | Status: DC | PRN
  Administered 2022-07-07: 10 mg via INTRAVENOUS

## 2022-07-07 MED ORDER — GLYCOPYRROLATE 0.2 MG/ML IJ SOLN
INTRAMUSCULAR | Status: DC | PRN
  Administered 2022-07-07: .2 mg via INTRAVENOUS

## 2022-07-07 MED ORDER — ENSURE PRE-SURGERY PO LIQD
592.0000 mL | Freq: Once | ORAL | Status: DC
  Filled 2022-07-07: qty 592

## 2022-07-07 MED ORDER — ENOXAPARIN SODIUM 40 MG/0.4ML IJ SOSY
40.0000 mg | PREFILLED_SYRINGE | INTRAMUSCULAR | Status: DC
  Administered 2022-07-08: 40 mg via SUBCUTANEOUS
  Filled 2022-07-07: qty 0.4

## 2022-07-07 MED ORDER — GABAPENTIN 300 MG PO CAPS
300.0000 mg | ORAL_CAPSULE | ORAL | Status: AC
  Administered 2022-07-07: 300 mg via ORAL
  Filled 2022-07-07: qty 1

## 2022-07-07 MED ORDER — SUGAMMADEX SODIUM 200 MG/2ML IV SOLN
INTRAVENOUS | Status: DC | PRN
  Administered 2022-07-07: 200 mg via INTRAVENOUS

## 2022-07-07 MED ORDER — SIMETHICONE 80 MG PO CHEW: 40.0000 mg | CHEWABLE_TABLET | Freq: Four times a day (QID) | ORAL | Status: DC | PRN

## 2022-07-07 MED ORDER — BUPIVACAINE-EPINEPHRINE 0.25% -1:200000 IJ SOLN
INTRAMUSCULAR | Status: DC | PRN
  Administered 2022-07-07: 30 mL

## 2022-07-07 MED ORDER — PHENYLEPHRINE HCL (PRESSORS) 10 MG/ML IV SOLN
INTRAVENOUS | Status: AC
  Filled 2022-07-07: qty 1

## 2022-07-07 MED ORDER — LACTATED RINGERS IV SOLN: INTRAVENOUS | Status: DC | PRN

## 2022-07-07 MED ORDER — MIDAZOLAM HCL 5 MG/5ML IJ SOLN
INTRAMUSCULAR | Status: DC | PRN
  Administered 2022-07-07: 2 mg via INTRAVENOUS

## 2022-07-07 MED ORDER — ONDANSETRON HCL 4 MG/2ML IJ SOLN
INTRAMUSCULAR | Status: AC
  Filled 2022-07-07: qty 2

## 2022-07-07 MED ORDER — ROCURONIUM BROMIDE 10 MG/ML (PF) SYRINGE
PREFILLED_SYRINGE | INTRAVENOUS | Status: DC | PRN
  Administered 2022-07-07: 20 mg via INTRAVENOUS
  Administered 2022-07-07: 10 mg via INTRAVENOUS
  Administered 2022-07-07: 100 mg via INTRAVENOUS

## 2022-07-07 MED ORDER — HYDROMORPHONE HCL 1 MG/ML IJ SOLN
0.5000 mg | INTRAMUSCULAR | Status: DC | PRN
  Administered 2022-07-07 (×2): 0.5 mg via INTRAVENOUS
  Filled 2022-07-07 (×2): qty 0.5

## 2022-07-07 MED ORDER — SODIUM CHLORIDE 0.9 % IV SOLN
2.0000 g | Freq: Two times a day (BID) | INTRAVENOUS | Status: AC
  Administered 2022-07-08: 2 g via INTRAVENOUS
  Filled 2022-07-07: qty 2

## 2022-07-07 MED ORDER — HYDROMORPHONE HCL 1 MG/ML IJ SOLN
INTRAMUSCULAR | Status: AC
  Filled 2022-07-07: qty 1

## 2022-07-07 MED ORDER — HEPARIN SODIUM (PORCINE) 5000 UNIT/ML IJ SOLN
5000.0000 [IU] | Freq: Once | INTRAMUSCULAR | Status: AC
  Administered 2022-07-07: 5000 [IU] via SUBCUTANEOUS
  Filled 2022-07-07: qty 1

## 2022-07-07 MED ORDER — PROPOFOL 10 MG/ML IV BOLUS
INTRAVENOUS | Status: AC
  Filled 2022-07-07: qty 20

## 2022-07-07 MED ORDER — BUPIVACAINE LIPOSOME 1.3 % IJ SUSP: 20.0000 mL | Freq: Once | INTRAMUSCULAR | Status: DC

## 2022-07-07 MED ORDER — EPHEDRINE 5 MG/ML INJ
INTRAVENOUS | Status: AC
  Filled 2022-07-07: qty 5

## 2022-07-07 MED ORDER — LACTATED RINGERS IR SOLN
Status: DC | PRN
  Administered 2022-07-07: 1000 mL

## 2022-07-07 MED ORDER — MIDAZOLAM HCL 2 MG/2ML IJ SOLN
INTRAMUSCULAR | Status: AC
  Filled 2022-07-07: qty 2

## 2022-07-07 MED ORDER — DEXAMETHASONE SODIUM PHOSPHATE 10 MG/ML IJ SOLN
INTRAMUSCULAR | Status: DC | PRN
  Administered 2022-07-07: 10 mg via INTRAVENOUS

## 2022-07-07 MED ORDER — HYDROMORPHONE HCL 1 MG/ML IJ SOLN
0.2500 mg | INTRAMUSCULAR | Status: DC | PRN
  Administered 2022-07-07: 0.5 mg via INTRAVENOUS

## 2022-07-07 MED ORDER — SACCHAROMYCES BOULARDII 250 MG PO CAPS
250.0000 mg | ORAL_CAPSULE | Freq: Two times a day (BID) | ORAL | Status: DC
  Administered 2022-07-07 – 2022-07-08 (×3): 250 mg via ORAL
  Filled 2022-07-07 (×3): qty 1

## 2022-07-07 MED ORDER — ACETAMINOPHEN 500 MG PO TABS
1000.0000 mg | ORAL_TABLET | Freq: Four times a day (QID) | ORAL | Status: DC
  Administered 2022-07-08 – 2022-07-09 (×5): 1000 mg via ORAL
  Filled 2022-07-07 (×7): qty 2

## 2022-07-07 MED ORDER — ENSURE PRE-SURGERY PO LIQD
296.0000 mL | Freq: Once | ORAL | Status: DC
  Filled 2022-07-07: qty 296

## 2022-07-07 MED ORDER — FENTANYL CITRATE (PF) 100 MCG/2ML IJ SOLN
INTRAMUSCULAR | Status: AC
  Filled 2022-07-07: qty 2

## 2022-07-07 MED ORDER — PROPOFOL 10 MG/ML IV BOLUS
INTRAVENOUS | Status: DC | PRN
  Administered 2022-07-07: 150 mg via INTRAVENOUS
  Administered 2022-07-07 (×2): 50 mg via INTRAVENOUS

## 2022-07-07 MED ORDER — 0.9 % SODIUM CHLORIDE (POUR BTL) OPTIME
TOPICAL | Status: DC | PRN
  Administered 2022-07-07: 2000 mL

## 2022-07-07 MED ORDER — POLYETHYLENE GLYCOL 3350 17 GM/SCOOP PO POWD: 1.0000 | Freq: Once | ORAL | Status: DC

## 2022-07-07 MED ORDER — BUPIVACAINE HCL 0.25 % IJ SOLN
INTRAMUSCULAR | Status: AC
  Filled 2022-07-07: qty 1

## 2022-07-07 MED ORDER — BUPIVACAINE LIPOSOME 1.3 % IJ SUSP
INTRAMUSCULAR | Status: AC
  Filled 2022-07-07: qty 20

## 2022-07-07 MED ORDER — ALUM & MAG HYDROXIDE-SIMETH 200-200-20 MG/5ML PO SUSP: 30.0000 mL | Freq: Four times a day (QID) | ORAL | Status: DC | PRN

## 2022-07-07 MED ORDER — ALVIMOPAN 12 MG PO CAPS
12.0000 mg | ORAL_CAPSULE | ORAL | Status: AC
  Administered 2022-07-07: 12 mg via ORAL
  Filled 2022-07-07: qty 1

## 2022-07-07 MED ORDER — KCL IN DEXTROSE-NACL 20-5-0.45 MEQ/L-%-% IV SOLN
INTRAVENOUS | Status: DC
  Filled 2022-07-07 (×2): qty 1000

## 2022-07-07 MED ORDER — DIPHENHYDRAMINE HCL 50 MG/ML IJ SOLN: 12.5000 mg | Freq: Four times a day (QID) | INTRAMUSCULAR | Status: DC | PRN

## 2022-07-07 MED ORDER — TAMSULOSIN HCL 0.4 MG PO CAPS
0.4000 mg | ORAL_CAPSULE | Freq: Every day | ORAL | Status: DC
  Administered 2022-07-08: 0.4 mg via ORAL
  Filled 2022-07-07 (×2): qty 1

## 2022-07-07 MED ORDER — OXYCODONE HCL 5 MG PO TABS
5.0000 mg | ORAL_TABLET | ORAL | Status: DC | PRN
  Administered 2022-07-07 – 2022-07-09 (×8): 10 mg via ORAL
  Filled 2022-07-07 (×8): qty 2

## 2022-07-07 MED ORDER — STERILE WATER FOR IRRIGATION IR SOLN
Status: DC | PRN
  Administered 2022-07-07: 1000 mL

## 2022-07-07 MED ORDER — LIDOCAINE 2% (20 MG/ML) 5 ML SYRINGE
INTRAMUSCULAR | Status: DC | PRN
  Administered 2022-07-07: 80 mg via INTRAVENOUS

## 2022-07-07 MED ORDER — SODIUM CHLORIDE 0.9 % IV SOLN
2.0000 g | INTRAVENOUS | Status: AC
  Administered 2022-07-07: 2 g via INTRAVENOUS
  Filled 2022-07-07: qty 2

## 2022-07-07 MED ORDER — FENTANYL CITRATE (PF) 100 MCG/2ML IJ SOLN
INTRAMUSCULAR | Status: DC | PRN
  Administered 2022-07-07 (×3): 50 ug via INTRAVENOUS
  Administered 2022-07-07: 100 ug via INTRAVENOUS
  Administered 2022-07-07 (×2): 50 ug via INTRAVENOUS

## 2022-07-07 MED ORDER — ALVIMOPAN 12 MG PO CAPS
12.0000 mg | ORAL_CAPSULE | Freq: Two times a day (BID) | ORAL | Status: DC
  Administered 2022-07-08: 12 mg via ORAL
  Filled 2022-07-07 (×2): qty 1

## 2022-07-07 MED ORDER — BUPIVACAINE LIPOSOME 1.3 % IJ SUSP
INTRAMUSCULAR | Status: DC | PRN
  Administered 2022-07-07: 20 mL

## 2022-07-07 MED ORDER — ONDANSETRON HCL 4 MG/2ML IJ SOLN
4.0000 mg | Freq: Four times a day (QID) | INTRAMUSCULAR | Status: DC | PRN
  Filled 2022-07-07: qty 2

## 2022-07-07 MED ORDER — LACTATED RINGERS IV SOLN: INTRAVENOUS | Status: DC

## 2022-07-07 MED ORDER — PHENYLEPHRINE 80 MCG/ML (10ML) SYRINGE FOR IV PUSH (FOR BLOOD PRESSURE SUPPORT)
PREFILLED_SYRINGE | INTRAVENOUS | Status: DC | PRN
  Administered 2022-07-07 (×2): 80 ug via INTRAVENOUS
  Administered 2022-07-07: 160 ug via INTRAVENOUS
  Administered 2022-07-07 (×2): 80 ug via INTRAVENOUS

## 2022-07-07 MED ORDER — CHLORHEXIDINE GLUCONATE 0.12 % MT SOLN
15.0000 mL | Freq: Once | OROMUCOSAL | Status: AC
  Administered 2022-07-07: 15 mL via OROMUCOSAL

## 2022-07-07 MED ORDER — HYDRALAZINE HCL 20 MG/ML IJ SOLN
INTRAMUSCULAR | Status: DC | PRN
  Administered 2022-07-07: 5 mg via INTRAVENOUS

## 2022-07-07 MED ORDER — ORAL CARE MOUTH RINSE: 15.0000 mL | Freq: Once | OROMUCOSAL | Status: AC

## 2022-07-07 MED ORDER — CLEVIDIPINE BUTYRATE 0.5 MG/ML IV EMUL
1.0000 mg/h | INTRAVENOUS | Status: DC
  Filled 2022-07-07: qty 50

## 2022-07-07 MED ORDER — FENTANYL CITRATE (PF) 250 MCG/5ML IJ SOLN
INTRAMUSCULAR | Status: AC
  Filled 2022-07-07: qty 5

## 2022-07-07 MED ORDER — ONDANSETRON HCL 4 MG PO TABS: 4.0000 mg | ORAL_TABLET | Freq: Four times a day (QID) | ORAL | Status: DC | PRN

## 2022-07-07 MED ORDER — ONDANSETRON HCL 4 MG/2ML IJ SOLN
INTRAMUSCULAR | Status: DC | PRN
  Administered 2022-07-07: 4 mg via INTRAVENOUS

## 2022-07-07 MED ORDER — GLYCOPYRROLATE 0.2 MG/ML IJ SOLN
INTRAMUSCULAR | Status: AC
  Filled 2022-07-07: qty 1

## 2022-07-07 MED ORDER — ACETAMINOPHEN 500 MG PO TABS
1000.0000 mg | ORAL_TABLET | ORAL | Status: AC
  Administered 2022-07-07: 1000 mg via ORAL
  Filled 2022-07-07: qty 2

## 2022-07-07 MED ORDER — ENSURE SURGERY PO LIQD
237.0000 mL | Freq: Two times a day (BID) | ORAL | Status: DC
  Administered 2022-07-08: 237 mL via ORAL

## 2022-07-07 MED ORDER — BISACODYL 5 MG PO TBEC: 20.0000 mg | DELAYED_RELEASE_TABLET | Freq: Once | ORAL | Status: DC

## 2022-07-07 MED ORDER — GABAPENTIN 100 MG PO CAPS
300.0000 mg | ORAL_CAPSULE | Freq: Two times a day (BID) | ORAL | Status: DC
  Administered 2022-07-07 – 2022-07-08 (×3): 300 mg via ORAL
  Filled 2022-07-07 (×3): qty 3

## 2022-07-07 SURGICAL SUPPLY — 89 items
ADH SKN CLS APL DERMABOND .7 (GAUZE/BANDAGES/DRESSINGS) ×1
BAG COUNTER SPONGE SURGICOUNT (BAG) ×1 IMPLANT
BAG SPNG CNTER NS LX DISP (BAG) ×1
BLADE EXTENDED COATED 6.5IN (ELECTRODE) IMPLANT
CANNULA REDUCER 12-8 DVNC XI (CANNULA) IMPLANT
CELLS DAT CNTRL 66122 CELL SVR (MISCELLANEOUS) IMPLANT
COVER SURGICAL LIGHT HANDLE (MISCELLANEOUS) ×2 IMPLANT
COVER TIP SHEARS 8 DVNC (MISCELLANEOUS) ×1 IMPLANT
DERMABOND ADVANCED .7 DNX12 (GAUZE/BANDAGES/DRESSINGS) IMPLANT
DRAIN CHANNEL 19F RND (DRAIN) IMPLANT
DRAPE ARM DVNC X/XI (DISPOSABLE) ×4 IMPLANT
DRAPE COLUMN DVNC XI (DISPOSABLE) ×1 IMPLANT
DRAPE SURG IRRIG POUCH 19X23 (DRAPES) ×1 IMPLANT
DRIVER NDL LRG 8 DVNC XI (INSTRUMENTS) ×1 IMPLANT
DRIVER NDLE LRG 8 DVNC XI (INSTRUMENTS) ×1 IMPLANT
DRSG OPSITE POSTOP 4X10 (GAUZE/BANDAGES/DRESSINGS) IMPLANT
DRSG OPSITE POSTOP 4X6 (GAUZE/BANDAGES/DRESSINGS) IMPLANT
DRSG OPSITE POSTOP 4X8 (GAUZE/BANDAGES/DRESSINGS) IMPLANT
ELECT PENCIL ROCKER SW 15FT (MISCELLANEOUS) ×1 IMPLANT
ELECT REM PT RETURN 15FT ADLT (MISCELLANEOUS) ×1 IMPLANT
ENDOLOOP SUT PDS II  0 18 (SUTURE)
ENDOLOOP SUT PDS II 0 18 (SUTURE) IMPLANT
EVACUATOR SILICONE 100CC (DRAIN) IMPLANT
GLOVE BIO SURGEON STRL SZ 6.5 (GLOVE) ×3 IMPLANT
GLOVE BIOGEL PI IND STRL 7.0 (GLOVE) ×2 IMPLANT
GLOVE INDICATOR 6.5 STRL GRN (GLOVE) ×1 IMPLANT
GOWN SRG XL LVL 4 BRTHBL STRL (GOWNS) ×1 IMPLANT
GOWN STRL NON-REIN XL LVL4 (GOWNS) ×1
GOWN STRL REUS W/ TWL XL LVL3 (GOWN DISPOSABLE) ×3 IMPLANT
GOWN STRL REUS W/TWL XL LVL3 (GOWN DISPOSABLE) ×3
GRASPER SUT TROCAR 14GX15 (MISCELLANEOUS) IMPLANT
GRASPER TIP-UP FEN DVNC XI (INSTRUMENTS) ×1 IMPLANT
HOLDER FOLEY CATH W/STRAP (MISCELLANEOUS) ×1 IMPLANT
IRRIG SUCT STRYKERFLOW 2 WTIP (MISCELLANEOUS) ×1
IRRIGATION SUCT STRKRFLW 2 WTP (MISCELLANEOUS) ×1 IMPLANT
KIT PROCEDURE DVNC SI (MISCELLANEOUS) IMPLANT
KIT TURNOVER KIT A (KITS) IMPLANT
NDL INSUFFLATION 14GA 120MM (NEEDLE) ×1 IMPLANT
NEEDLE INSUFFLATION 14GA 120MM (NEEDLE) ×1 IMPLANT
PACK CARDIOVASCULAR III (CUSTOM PROCEDURE TRAY) ×1 IMPLANT
PACK COLON (CUSTOM PROCEDURE TRAY) ×1 IMPLANT
PAD POSITIONING PINK XL (MISCELLANEOUS) ×1 IMPLANT
RELOAD STAPLE 60 3.5 BLU DVNC (STAPLE) IMPLANT
RELOAD STAPLE 60 4.3 GRN DVNC (STAPLE) IMPLANT
RELOAD STAPLER 3.5X60 BLU DVNC (STAPLE) IMPLANT
RELOAD STAPLER 4.3X60 GRN DVNC (STAPLE) ×1 IMPLANT
RETRACTOR WND ALEXIS 18 MED (MISCELLANEOUS) IMPLANT
RTRCTR WOUND ALEXIS 18CM MED (MISCELLANEOUS)
SCISSORS LAP 5X35 DISP (ENDOMECHANICALS) IMPLANT
SCISSORS MNPLR CVD DVNC XI (INSTRUMENTS) ×1 IMPLANT
SEAL UNIV 5-12 XI (MISCELLANEOUS) ×3 IMPLANT
SEALER VESSEL EXT DVNC XI (MISCELLANEOUS) ×1 IMPLANT
SOL ELECTROSURG ANTI STICK (MISCELLANEOUS)
SOLUTION ELECTROSURG ANTI STCK (MISCELLANEOUS) ×1 IMPLANT
SPIKE FLUID TRANSFER (MISCELLANEOUS) IMPLANT
STAPLER 60 SUREFORM DVNC (STAPLE) IMPLANT
STAPLER ECHELON POWER CIR 29 (STAPLE) IMPLANT
STAPLER ECHELON POWER CIR 31 (STAPLE) IMPLANT
STAPLER RELOAD 3.5X60 BLU DVNC (STAPLE)
STAPLER RELOAD 4.3X60 GRN DVNC (STAPLE) ×1
STOPCOCK 4 WAY LG BORE MALE ST (IV SETS) ×2 IMPLANT
SUT ETHILON 2 0 PS N (SUTURE) IMPLANT
SUT NOVA NAB GS-21 1 T12 (SUTURE) ×2 IMPLANT
SUT PROLENE 2 0 KS (SUTURE) IMPLANT
SUT SILK 2 0 (SUTURE) ×1
SUT SILK 2 0 SH CR/8 (SUTURE) IMPLANT
SUT SILK 2-0 18XBRD TIE 12 (SUTURE) ×1 IMPLANT
SUT SILK 3 0 (SUTURE)
SUT SILK 3 0 SH CR/8 (SUTURE) ×1 IMPLANT
SUT SILK 3-0 18XBRD TIE 12 (SUTURE) IMPLANT
SUT V-LOC BARB 180 2/0GR6 GS22 (SUTURE)
SUT VIC AB 2-0 SH 18 (SUTURE) IMPLANT
SUT VIC AB 2-0 SH 27 (SUTURE)
SUT VIC AB 2-0 SH 27X BRD (SUTURE) IMPLANT
SUT VIC AB 3-0 SH 18 (SUTURE) IMPLANT
SUT VIC AB 4-0 PS2 27 (SUTURE) ×2 IMPLANT
SUT VICRYL 0 UR6 27IN ABS (SUTURE) ×1 IMPLANT
SUTURE V-LC BRB 180 2/0GR6GS22 (SUTURE) IMPLANT
SYR 20ML ECCENTRIC (SYRINGE) ×1 IMPLANT
SYS LAPSCP GELPORT 120MM (MISCELLANEOUS)
SYS WOUND ALEXIS 18CM MED (MISCELLANEOUS)
SYSTEM LAPSCP GELPORT 120MM (MISCELLANEOUS) IMPLANT
SYSTEM WOUND ALEXIS 18CM MED (MISCELLANEOUS) IMPLANT
TOWEL OR 17X26 10 PK STRL BLUE (TOWEL DISPOSABLE) IMPLANT
TOWEL OR NON WOVEN STRL DISP B (DISPOSABLE) ×1 IMPLANT
TRAY FOLEY MTR SLVR 16FR STAT (SET/KITS/TRAYS/PACK) ×1 IMPLANT
TROCAR ADV FIXATION 5X100MM (TROCAR) ×1 IMPLANT
TUBING CONNECTING 10 (TUBING) ×2 IMPLANT
TUBING INSUFFLATION 10FT LAP (TUBING) ×1 IMPLANT

## 2022-07-07 NOTE — Anesthesia Preprocedure Evaluation (Addendum)
Anesthesia Evaluation  Patient identified by MRN, date of birth, ID band Patient awake    Reviewed: Allergy & Precautions, H&P , NPO status , Patient's Chart, lab work & pertinent test results  Airway Mallampati: II  TM Distance: >3 FB Neck ROM: Full    Dental no notable dental hx. (+) Teeth Intact, Dental Advisory Given   Pulmonary neg pulmonary ROS   Pulmonary exam normal breath sounds clear to auscultation       Cardiovascular + DVT   Rhythm:Regular Rate:Normal     Neuro/Psych negative neurological ROS  negative psych ROS   GI/Hepatic negative GI ROS, Neg liver ROS,,,  Endo/Other    Morbid obesity  Renal/GU negative Renal ROS  negative genitourinary   Musculoskeletal   Abdominal   Peds  Hematology negative hematology ROS (+)   Anesthesia Other Findings   Reproductive/Obstetrics negative OB ROS                             Anesthesia Physical Anesthesia Plan  ASA: 2  Anesthesia Plan: General   Post-op Pain Management: Tylenol PO (pre-op)*   Induction: Intravenous  PONV Risk Score and Plan: 3 and Ondansetron, Dexamethasone and Midazolam  Airway Management Planned: Oral ETT  Additional Equipment:   Intra-op Plan:   Post-operative Plan: Extubation in OR  Informed Consent: I have reviewed the patients History and Physical, chart, labs and discussed the procedure including the risks, benefits and alternatives for the proposed anesthesia with the patient or authorized representative who has indicated his/her understanding and acceptance.     Dental advisory given  Plan Discussed with: CRNA  Anesthesia Plan Comments:        Anesthesia Quick Evaluation

## 2022-07-07 NOTE — Anesthesia Postprocedure Evaluation (Signed)
Anesthesia Post Note  Patient: Danny Fox  Procedure(s) Performed: ROBOTIC LOW ANTERIOR COLON RESECTION, BILATERAL TAP BLOCK     Patient location during evaluation: PACU Anesthesia Type: General Level of consciousness: awake and alert Pain management: pain level controlled Vital Signs Assessment: post-procedure vital signs reviewed and stable Respiratory status: spontaneous breathing, nonlabored ventilation, respiratory function stable and patient connected to nasal cannula oxygen Cardiovascular status: blood pressure returned to baseline and stable Postop Assessment: no apparent nausea or vomiting Anesthetic complications: no  No notable events documented.  Last Vitals:  Vitals:   07/07/22 1500 07/07/22 1515  BP: (!) 144/89 132/89  Pulse: 66 (!) 59  Resp:    Temp: (!) 36.2 C   SpO2: 98% 100%    Last Pain:  Vitals:   07/07/22 1435  TempSrc:   PainSc: Asleep                 Amante Fomby,W. EDMOND

## 2022-07-07 NOTE — Transfer of Care (Signed)
Immediate Anesthesia Transfer of Care Note  Patient: Danny Fox  Procedure(s) Performed: ROBOTIC LOW ANTERIOR COLON RESECTION, BILATERAL TAP BLOCK  Patient Location: PACU  Anesthesia Type:General  Level of Consciousness: sedated, patient cooperative, and responds to stimulation  Airway & Oxygen Therapy: Patient Spontanous Breathing and Patient connected to face mask oxygen  Post-op Assessment: Report given to RN and Post -op Vital signs reviewed and stable  Post vital signs: Reviewed and stable  Last Vitals:  Vitals Value Taken Time  BP 148/104 07/07/22 1435  Temp 36.2 C 07/07/22 1435  Pulse 73 07/07/22 1437  Resp 9 07/07/22 1437  SpO2 100 % 07/07/22 1437  Vitals shown include unvalidated device data.  Last Pain:  Vitals:   07/07/22 0759  TempSrc: Oral         Complications: No notable events documented.

## 2022-07-07 NOTE — Interval H&P Note (Signed)
History and Physical Interval Note:  07/07/2022 7:53 AM  Danny Fox  has presented today for surgery, with the diagnosis of COLON CANCER.  The various methods of treatment have been discussed with the patient and family. After consideration of risks, benefits and other options for treatment, the patient has consented to  Procedure(s): ROBOTIC PARTIAL COLECTOMY (N/A) as a surgical intervention.  The patient's history has been reviewed, patient examined, no change in status, stable for surgery.  I have reviewed the patient's chart and labs.  Questions were answered to the patient's satisfaction.     Vanita Panda, MD  Colorectal and General Surgery [facility] Surgery

## 2022-07-07 NOTE — Progress Notes (Signed)
MDA at bedside.  Only new orders would like patients diastolic to be under 100.  Patient endorses having a lot of pain.  Will treat pain and see if it improves patients BP.  Will continue to monitor and notify for further changes.

## 2022-07-07 NOTE — Anesthesia Procedure Notes (Signed)
Procedure Name: Intubation Date/Time: 07/07/2022 11:31 AM  Performed by: Kizzie Fantasia, CRNAPre-anesthesia Checklist: Patient identified, Emergency Drugs available, Suction available, Patient being monitored and Timeout performed Patient Re-evaluated:Patient Re-evaluated prior to induction Oxygen Delivery Method: Circle system utilized Preoxygenation: Pre-oxygenation with 100% oxygen Induction Type: IV induction Ventilation: Mask ventilation without difficulty Laryngoscope Size: Mac and 4 Grade View: Grade I Tube type: Oral Tube size: 7.5 mm Number of attempts: 1 Airway Equipment and Method: Stylet Placement Confirmation: ETT inserted through vocal cords under direct vision, positive ETCO2 and breath sounds checked- equal and bilateral Secured at: 23 cm Tube secured with: Tape Dental Injury: Teeth and Oropharynx as per pre-operative assessment

## 2022-07-07 NOTE — Progress Notes (Signed)
MDA notified patients speech seems to be slurred.  He has only been in the recovery room for about 15 minutes.  Grips strong and equal.  MDA will come to the bedside to assess.

## 2022-07-07 NOTE — Op Note (Signed)
07/07/2022  2:25 PM  PATIENT:  Ronnald Nian  62 y.o. male  Patient Care Team: Nsumanganyi, Colleen Can, NP as PCP - General Wall, Jesse Sans, MD (Inactive) as Attending Physician (Cardiology)  PRE-OPERATIVE DIAGNOSIS:  COLON CANCER  POST-OPERATIVE DIAGNOSIS:  COLON CANCER  PROCEDURE:  ROBOTIC LOW ANTERIOR COLON RESECTION, BILATERAL TAP BLOCK   Surgeon(s): Romie Levee, MD Axel Filler, MD  ASSISTANT: Dr Derrell Lolling   ANESTHESIA:   local and general  EBL: 50ml Total I/O In: 100 [IV Piggyback:100] Out: 150 [Urine:100; Blood:50]  Delay start of Pharmacological VTE agent (>24hrs) due to surgical blood loss or risk of bleeding:  no  DRAINS: none   SPECIMEN:  Source of Specimen:  Rectosigmoid  DISPOSITION OF SPECIMEN:  PATHOLOGY  COUNTS:  YES  PLAN OF CARE: Admit to inpatient   PATIENT DISPOSITION:  PACU - hemodynamically stable.  INDICATION:    62 y.o. M with rectosigmoid mass.  I recommended low anterior resection:  The anatomy & physiology of the digestive tract was discussed.  The pathophysiology was discussed.  Natural history risks without surgery was discussed.   I worked to give an overview of the disease and the frequent need to have multispecialty involvement.  I feel the risks of no intervention will lead to serious problems that outweigh the operative risks; therefore, I recommended a partial colectomy to remove the pathology.  Laparoscopic & open techniques were discussed.   Risks such as bleeding, infection, abscess, leak, reoperation, possible ostomy, hernia, heart attack, death, and other risks were discussed.  I noted a good likelihood this will help address the problem.   Goals of post-operative recovery were discussed as well.    The patient expressed understanding & wished to proceed with surgery.  OR FINDINGS:   Patient had a mass at the rectosigmoid area.  No obvious metastatic disease on visceral parietal peritoneum or liver.  The  anastomosis rests ~11 cm from the anal verge by rigid proctoscopy.  DESCRIPTION:   Informed consent was confirmed.  The patient underwent general anaesthesia without difficulty.  The patient was positioned appropriately.  VTE prevention in place.  The patient's abdomen was clipped, prepped, & draped in a sterile fashion.  Surgical timeout confirmed our plan.  The patient was positioned in reverse Trendelenburg.  Abdominal entry was gained using a Varies needle in the LUQ.  Entry was clean.  I induced carbon dioxide insufflation.  An 8mm robotic port was placed in the RUQ.  Camera inspection revealed no injury.  Extra ports were carefully placed under direct laparoscopic visualization.  I laparoscopically reflected the greater omentum and the upper abdomen the small bowel in the upper abdomen. The patient was appropriately positioned and the robot was docked to the patient's left side.  Instruments were placed under direct visualization.    I mobilized the sigmoid colon off of the pelvic sidewall.  There were significant adhesions in the pelvis causing the sigmoid colon to loop.  The remaining descending colon was adherent in the mid small bowel mesentery.  I spent approximately 1 hour releasing adhesions and straightening out the sigmoid colon.  This was done mostly with blunt dissection and the robotic vessel sealer.  I was able to find the left and right ureters in their standard anatomical position and These freed from the resection field.  Once I was able to get the loop of sigmoid out of the pelvis, I scored the base of peritoneum of the right side of the mesentery of the  left colon from the ligament of Treitz to the peritoneal reflection of the mid rectum.  The patient had tattoo noted at the rectosigmoid junction.  I elevated the sigmoid mesentery and enetered into the retro-mesenteric plane. We were able to identify the left ureter and gonadal vessels. We kept those posterior within the  retroperitoneum and elevated the left colon mesentery off that. I did isolated IMA pedicle but did not ligate it yet.  I continued distally and got into the avascular plane posterior to the mesorectum. This allowed me to help mobilize the rectum as well by freeing the mesorectum off the sacrum.  I mobilized the peritoneal coverings towards the peritoneal reflection on both the right and left sides of the rectum.  I could see the right and left ureters and stayed away from them.    I skeletonized the inferior mesenteric artery pedicle.  I went down to its takeoff from the aorta.  After confirming the left ureter was out of the way, I went ahead and ligated the inferior mesenteric artery pedicle with bipolar robotic vessel sealer ~2cm above its takeoff from the aorta.  I skeletonized the mesorectum at the junction at the proximal rectum at the distal edge of the distal tattoo using blunt dissection & bipolar robotic vessel sealer.  I mobilized the left colon from the small bowel mesentery carefully to ensure good mobilization of the left colon to reach into the pelvis.  Once this was complete, a green load robotic stapler was used to transect the rectum.  The robot was then undocked.  The 12 mm suprapubic port was enlarged into a Pfannenstiel incision.  An Alexis wound protector was placed.  The colon was brought out of the wound.  There appeared to be a good distal margin.  What appeared to be the sigmoid descending colon junction was transected using a pursestring device and a 2-0 Prolene pursestring was placed.  This was secured with 3-0 silk sutures and a 29 mm EEA anvil was placed.  Pursestring was tied tightly around this.  This was then placed back into the pelvis.  The EEA stapler was inserted into the rectum and brought out through the superior portion of the stapled edge.  An anastomosis was created under laparoscopic visualization.  There was no tension noted on the anastomosis.  There was no leak when  tested with insufflation under irrigation.  Hemostasis was good and the pelvis and abdomen.  There was no sign of injury.  The abdomen was desufflated and the instruments were removed.  We switched to clean gowns, gloves, instruments and drapes.  The Pfannenstiel peritoneum was closed using a running 0 Vicryl suture.  The fascia was closed using two #1 Novafil running sutures.  Subcutaneous tissue was reapproximated using a running 2-0 Vicryl suture and the skin was closed using a running 4-0 Vicryl subcuticular suture.  A dressing was applied.  The remaining port sites were closed using interrupted 4-0 Vicryl sutures and Dermabond.  The patient was then awakened from anesthesia and sent to the postanesthesia care unit in stable condition.  All counts were correct per operating room staff.  An MD assistant was necessary for tissue manipulation, retraction and positioning due to the complexity of the case and hospital policies.  Vanita Panda, MD  Colorectal and General Surgery [facility] Surgery

## 2022-07-08 ENCOUNTER — Other Ambulatory Visit: Payer: Self-pay

## 2022-07-08 LAB — CBC
HCT: 37.2 % — ABNORMAL LOW (ref 39.0–52.0)
Hemoglobin: 12.1 g/dL — ABNORMAL LOW (ref 13.0–17.0)
MCH: 30.2 pg (ref 26.0–34.0)
MCHC: 32.5 g/dL (ref 30.0–36.0)
MCV: 92.8 fL (ref 80.0–100.0)
Platelets: 193 10*3/uL (ref 150–400)
RBC: 4.01 MIL/uL — ABNORMAL LOW (ref 4.22–5.81)
RDW: 13 % (ref 11.5–15.5)
WBC: 7.1 10*3/uL (ref 4.0–10.5)
nRBC: 0 % (ref 0.0–0.2)

## 2022-07-08 LAB — BASIC METABOLIC PANEL
Anion gap: 9 (ref 5–15)
BUN: 9 mg/dL (ref 8–23)
CO2: 24 mmol/L (ref 22–32)
Calcium: 8.6 mg/dL — ABNORMAL LOW (ref 8.9–10.3)
Chloride: 101 mmol/L (ref 98–111)
Creatinine, Ser: 1.43 mg/dL — ABNORMAL HIGH (ref 0.61–1.24)
GFR, Estimated: 55 mL/min — ABNORMAL LOW (ref >60)
Glucose, Bld: 140 mg/dL — ABNORMAL HIGH (ref 70–99)
Potassium: 4 mmol/L (ref 3.5–5.1)
Sodium: 134 mmol/L — ABNORMAL LOW (ref 135–145)

## 2022-07-08 MED ORDER — CHLORHEXIDINE GLUCONATE CLOTH 2 % EX PADS: 6.0000 | MEDICATED_PAD | Freq: Every day | CUTANEOUS | Status: DC

## 2022-07-08 NOTE — Progress Notes (Signed)
1 Day Post-Op Robotic LAR Subjective: Feels sore, tolerating clears.  No flatus yet  Objective: Vital signs in last 24 hours: Temp:  [95.9 F (35.5 C)-98.1 F (36.7 C)] 98 F (36.7 C) (05/24 0500) Pulse Rate:  [58-72] 61 (05/24 0500) Resp:  [9-18] 16 (05/24 0500) BP: (108-154)/(68-107) 108/70 (05/24 0500) SpO2:  [97 %-100 %] 97 % (05/24 0500) Weight:  [112.1 kg] 112.1 kg (05/24 0500)   Intake/Output from previous day: 05/23 0701 - 05/24 0700 In: 1362.2 [P.O.:180; I.V.:982.2; IV Piggyback:200] Out: 3200 [Urine:3100; Blood:100] Intake/Output this shift: No intake/output data recorded.   General appearance: alert and cooperative GI: normal findings: soft, non-distended  Incision: no significant drainage  Lab Results:  Recent Labs    07/08/22 0454  WBC 7.1  HGB 12.1*  HCT 37.2*  PLT 193   BMET Recent Labs    07/08/22 0454  NA 134*  K 4.0  CL 101  CO2 24  GLUCOSE 140*  BUN 9  CREATININE 1.43*  CALCIUM 8.6*   PT/INR No results for input(s): "LABPROT", "INR" in the last 72 hours. ABG No results for input(s): "PHART", "HCO3" in the last 72 hours.  Invalid input(s): "PCO2", "PO2"  MEDS, Scheduled  acetaminophen  1,000 mg Oral Q6H   alvimopan  12 mg Oral BID   Chlorhexidine Gluconate Cloth  6 each Topical Daily   enoxaparin (LOVENOX) injection  40 mg Subcutaneous Q24H   feeding supplement  237 mL Oral BID BM   gabapentin  300 mg Oral BID   saccharomyces boulardii  250 mg Oral BID   tamsulosin  0.4 mg Oral Daily    Studies/Results: No results found.  Assessment: s/p Procedure(s): ROBOTIC LOW ANTERIOR COLON RESECTION, BILATERAL TAP BLOCK Patient Active Problem List   Diagnosis Date Noted   Cancer of sigmoid colon (HCC) 07/07/2022   Rectal bleeding 04/27/2022   Acute pulmonary embolism (HCC) 12/08/2014   CKD (chronic kidney disease) stage 2, GFR 60-89 ml/min 12/08/2014   Pulmonary embolism (HCC) 12/08/2014   H/O noncompliance with medical  treatment, presenting hazards to health 03/27/2012   Chronic neck pain    Deep vein thrombosis (HCC) 06/30/2010    Expected post op course  Plan: d/c foley Advance diet to full liquids Ambulate in hall   LOS: 1 day     .Vanita Panda, MD [facility], [place]    07/08/2022 9:11 AM

## 2022-07-09 LAB — CBC
HCT: 36.7 % — ABNORMAL LOW (ref 39.0–52.0)
Hemoglobin: 12 g/dL — ABNORMAL LOW (ref 13.0–17.0)
MCH: 30.5 pg (ref 26.0–34.0)
MCHC: 32.7 g/dL (ref 30.0–36.0)
MCV: 93.1 fL (ref 80.0–100.0)
Platelets: 162 10*3/uL (ref 150–400)
RBC: 3.94 MIL/uL — ABNORMAL LOW (ref 4.22–5.81)
RDW: 13.4 % (ref 11.5–15.5)
WBC: 8.2 10*3/uL (ref 4.0–10.5)
nRBC: 0 % (ref 0.0–0.2)

## 2022-07-09 LAB — BASIC METABOLIC PANEL
Anion gap: 7 (ref 5–15)
BUN: 8 mg/dL (ref 8–23)
CO2: 24 mmol/L (ref 22–32)
Calcium: 8.7 mg/dL — ABNORMAL LOW (ref 8.9–10.3)
Chloride: 104 mmol/L (ref 98–111)
Creatinine, Ser: 1.17 mg/dL (ref 0.61–1.24)
GFR, Estimated: >60 mL/min (ref >60)
Glucose, Bld: 100 mg/dL — ABNORMAL HIGH (ref 70–99)
Potassium: 3.6 mmol/L (ref 3.5–5.1)
Sodium: 135 mmol/L (ref 135–145)

## 2022-07-09 NOTE — Discharge Summary (Signed)
Physician Discharge Summary  Patient ID: Danny Fox MRN: [id number] DOB/AGE: [birth year] 62 y.o.  Admit date: 07/07/2022 Discharge date: 07/09/2022  Admission Diagnoses: Colon cancer  Discharge Diagnoses:  Principal Problem:   Cancer of sigmoid colon [facility])   Discharged Condition: good  Hospital Course: Patient was admitted to the med surg floor after surgery.  Diet was advanced as tolerated.  Patient began to have bowel function on postop day 1.  By postop day 2, he was tolerating a solid diet and pain was controlled with oral medications.  He was urinating without difficulty and ambulating without assistance.  Patient was felt to be in stable condition for discharge to home.   Consults: None  Significant Diagnostic Studies: labs: cbc, bmet  Treatments: IV hydration, analgesia: acetaminophen and Oxycodone, and surgery: Robotic LAR  Discharge Exam: Blood pressure 125/80, pulse 75, temperature 98.4 F (36.9 C), temperature source Oral, resp. rate 18, height 5\' 9"  (1.753 m), weight 109.2 kg, SpO2 96 %. General appearance: alert and cooperative GI: soft, non-distended Incision/Wound: clean, dry, intact  Disposition: Discharge disposition: 01-Home or Self Care        Allergies as of 07/09/2022   No Known Allergies      Medication List     TAKE these medications    apixaban 5 MG Tabs tablet Commonly known as: ELIQUIS Take 2 tablets (10 mg total) by mouth 2 (two) times daily. What changed: how much to take   docusate sodium 100 MG capsule Commonly known as: COLACE Take 100-200 mg by mouth 2 (two) times daily as needed (constipation.).   Ex-Lax Ultra 5 MG EC tablet Generic drug: bisacodyl Take 5-10 mg by mouth daily as needed for moderate constipation.   ibuprofen 800 MG tablet Commonly known as: ADVIL Take 800 mg by mouth 2 (two) times daily as needed (pain.).   Oxycodone HCl 20 MG Tabs Take 10 mg by mouth every 6 (six) hours.   polyethylene glycol  powder 17 GM/SCOOP powder Commonly known as: GLYCOLAX/MIRALAX Take 17 g by mouth daily as needed (constipation).   Salonpas 3.02-20-08 % Ptch Generic drug: Camphor-Menthol-Methyl Sal Place 1 patch onto the skin every 12 (twelve) hours as needed (back pain.).   sildenafil 100 MG tablet Commonly known as: VIAGRA Take 100 mg by mouth daily as needed for erectile dysfunction.   tamsulosin 0.4 MG Caps capsule Commonly known as: FLOMAX Take 0.4 mg by mouth in the morning.   ZIMS MAX-FREEZE EX Apply 1 Application topically as needed (pain.).        Follow-up Information     Romie Levee, MD. Schedule an appointment as soon as possible for a visit in 2 week(s).   Specialties: General Surgery, Colon and Rectal Surgery Contact information: [address] [place] [address] [place] [address] [phone]                 Signed: Vanita Panda 07/09/2022, 7:44 AM

## 2022-07-09 NOTE — Discharge Instructions (Signed)
SURGERY: POST OP INSTRUCTIONS (Surgery for small bowel obstruction, colon resection, etc)   ######################################################################  EAT Gradually transition to a high fiber diet with a fiber supplement over the next few days after discharge  WALK Walk an hour a day.  Control your pain to do that.    CONTROL PAIN Control pain so that you can walk, sleep, tolerate sneezing/coughing, go up/down stairs.  HAVE A BOWEL MOVEMENT DAILY Keep your bowels regular to avoid problems.  OK to try a laxative to override constipation.  OK to use an antidairrheal to slow down diarrhea.  Call if not better after 2 tries  CALL IF YOU HAVE PROBLEMS/CONCERNS Call if you are still struggling despite following these instructions. Call if you have concerns not answered by these instructions  ######################################################################   DIET Follow a light diet the first few days at home.  Start with a bland diet such as soups, liquids, starchy foods, low fat foods, etc.  If you feel full, bloated, or constipated, stay on a ful liquid or pureed/blenderized diet for a few days until you feel better and no longer constipated. Be sure to drink plenty of fluids every day to avoid getting dehydrated (feeling dizzy, not urinating, etc.). Gradually add a fiber supplement to your diet over the next week.  Gradually get back to a regular solid diet.  Avoid fast food or heavy meals the first week as you are more likely to get nauseated. It is expected for your digestive tract to need a few months to get back to normal.  It is common for your bowel movements and stools to be irregular.  You will have occasional bloating and cramping that should eventually fade away.  Until you are eating solid food normally, off all pain medications, and back to regular activities; your bowels will not be normal. Focus on eating a low-fat, high fiber diet the rest of your life  (See Getting to Good Bowel Health, below).  CARE of your INCISION or WOUND  It is good for closed incisions and even open wounds to be washed every day.  Shower every day.  Short baths are fine.  Wash the incisions and wounds clean with soap & water.    You may leave closed incisions open to air if it is dry.   You may cover the incision with clean gauze & replace it after your daily shower for comfort.  STAPLES: You have skin staples.  Leave them in place & set up an appointment for them to be removed by a surgery office nurse ~10 days after surgery. = 1st week of January 2024    ACTIVITIES as tolerated Start light daily activities --- self-care, walking, climbing stairs-- beginning the day after surgery.  Gradually increase activities as tolerated.  Control your pain to be active.  Stop when you are tired.  Ideally, walk several times a day, eventually an hour a day.   Most people are back to most day-to-day activities in a few weeks.  It takes 4-8 weeks to get back to unrestricted, intense activity. If you can walk 30 minutes without difficulty, it is safe to try more intense activity such as jogging, treadmill, bicycling, low-impact aerobics, swimming, etc. Save the most intensive and strenuous activity for last (Usually 4-8 weeks after surgery) such as sit-ups, heavy lifting, contact sports, etc.  Refrain from any intense heavy lifting or straining until you are off narcotics for pain control.  You will have off days, but things should improve   week-by-week. DO NOT PUSH THROUGH PAIN.  Let pain be your guide: If it hurts to do something, don't do it.  Pain is your body warning you to avoid that activity for another week until the pain goes down. You may drive when you are no longer taking narcotic prescription pain medication, you can comfortably wear a seatbelt, and you can safely make sudden turns/stops to protect yourself without hesitating due to pain. You may have sexual intercourse when it  is comfortable. If it hurts to do something, stop.  MEDICATIONS Take your usually prescribed home medications unless otherwise directed.   Blood thinners:  Usually you can restart any strong blood thinners after the second postoperative day.  It is OK to take aspirin right away.     If you are on strong blood thinners (warfarin/Coumadin, Plavix, Xerelto, Eliquis, Pradaxa, etc), discuss with your surgeon, medicine PCP, and/or cardiologist for instructions on when to restart the blood thinner & if blood monitoring is needed (PT/INR blood check, etc).     PAIN CONTROL Pain after surgery or related to activity is often due to strain/injury to muscle, tendon, nerves and/or incisions.  This pain is usually short-term and will improve in a few months.  To help speed the process of healing and to get back to regular activity more quickly, DO THE FOLLOWING THINGS TOGETHER: Increase activity gradually.  DO NOT PUSH THROUGH PAIN Use Ice and/or Heat Try Gentle Massage and/or Stretching Take over the counter pain medication Take Narcotic prescription pain medication for more severe pain  Good pain control = faster recovery.  It is better to take more medicine to be more active than to stay in bed all day to avoid medications.  Increase activity gradually Avoid heavy lifting at first, then increase to lifting as tolerated over the next 6 weeks. Do not "push through" the pain.  Listen to your body and avoid positions and maneuvers than reproduce the pain.  Wait a few days before trying something more intense Walking an hour a day is encouraged to help your body recover faster and more safely.  Start slowly and stop when getting sore.  If you can walk 30 minutes without stopping or pain, you can try more intense activity (running, jogging, aerobics, cycling, swimming, treadmill, sex, sports, weightlifting, etc.) Remember: If it hurts to do it, then don't do it! Use Ice and/or Heat You will have swelling and  bruising around the incisions.  This will take several weeks to resolve. Ice packs or heating pads (6-8 times a day, 30-60 minutes at a time) will help sooth soreness & bruising. Some people prefer to use ice alone, heat alone, or alternate between ice & heat.  Experiment and see what works best for you.  Consider trying ice for the first few days to help decrease swelling and bruising; then, switch to heat to help relax sore spots and speed recovery. Shower every day.  Short baths are fine.  It feels good!  Keep the incisions and wounds clean with soap & water.   Try Gentle Massage and/or Stretching Massage at the area of pain many times a day Stop if you feel pain - do not overdo it Take over the counter pain medication This helps the muscle and nerve tissues become less irritable and calm down faster Choose ONE of the following over-the-counter anti-inflammatory medications: Acetaminophen 500mg tabs (Tylenol) 1-2 pills with every meal and just before bedtime (avoid if you have liver problems or if you have   acetaminophen in you narcotic prescription) Naproxen 220mg tabs (ex. Aleve, Naprosyn) 1-2 pills twice a day (avoid if you have kidney, stomach, IBD, or bleeding problems) Ibuprofen 200mg tabs (ex. Advil, Motrin) 3-4 pills with every meal and just before bedtime (avoid if you have kidney, stomach, IBD, or bleeding problems) Take with food/snack several times a day as directed for at least 2 weeks to help keep pain / soreness down & more manageable. Take Narcotic prescription pain medication for more severe pain A prescription for strong pain control is often given to you upon discharge (for example: oxycodone/Percocet, hydrocodone/Norco/Vicodin, or tramadol/Ultram) Take your pain medication as prescribed. Be mindful that most narcotic prescriptions contain Tylenol (acetaminophen) as well - avoid taking too much Tylenol. If you are having problems/concerns with the prescription medicine (does  not control pain, nausea, vomiting, rash, itching, etc.), please call us [phone] to see if we need to switch you to a different pain medicine that will work better for you and/or control your side effects better. If you need a refill on your pain medication, you must call the office before 4 pm and on weekdays only.  By federal law, prescriptions for narcotics cannot be called into a pharmacy.  They must be filled out on paper & picked up from our office by the patient or authorized caretaker.  Prescriptions cannot be filled after 4 pm nor on weekends.    WHEN TO CALL US [phone] Severe uncontrolled or worsening pain  Fever over 101 F (38.5 C) Concerns with the incision: Worsening pain, redness, rash/hives, swelling, bleeding, or drainage Reactions / problems with new medications (itching, rash, hives, nausea, etc.) Nausea and/or vomiting Difficulty urinating Difficulty breathing Worsening fatigue, dizziness, lightheadedness, blurred vision Other concerns If you are not getting better after two weeks or are noticing you are getting worse, contact our office [phone] for further advice.  We may need to adjust your medications, re-evaluate you in the office, send you to the emergency room, or see what other things we can do to help. The clinic staff is available to answer your questions during regular business hours (8:30am-5pm).  Please don't hesitate to call and ask to speak to one of our nurses for clinical concerns.    A surgeon from [facility] is always on call at the hospitals 24 hours/day If you have a medical emergency, go to the nearest emergency room or call 911.  FOLLOW UP in our office One the day of your discharge from the hospital (or the next business weekday), please call [facility] to set up or confirm an appointment to see your surgeon in the office for a follow-up appointment.  Usually it is 2-3 weeks after your surgery.   If you  have skin staples at your incision(s), let the office know so we can set up a time in the office for the nurse to remove them (usually around 10 days after surgery). Make sure that you call for appointments the day of discharge (or the next business weekday) from the hospital to ensure a convenient appointment time. IF YOU HAVE DISABILITY OR FAMILY LEAVE FORMS, BRING THEM TO THE OFFICE FOR PROCESSING.  DO NOT GIVE THEM TO YOUR DOCTOR.  [facility] [address], [address], [place], [place]  [address] ? [phone] - Main [phone] - Toll Free,  [phone] - Fax [email or web address]    GETTING TO GOOD BOWEL HEALTH. It is expected for your digestive tract to   need a few months to get back to normal.  It is common for your bowel movements and stools to be irregular.  You will have occasional bloating and cramping that should eventually fade away.  Until you are eating solid food normally, off all pain medications, and back to regular activities; your bowels will not be normal.   Avoiding constipation The goal: ONE SOFT BOWEL MOVEMENT A DAY!    Drink plenty of fluids.  Choose water first. TAKE A FIBER SUPPLEMENT EVERY DAY THE REST OF YOUR LIFE During your first week back home, gradually add back a fiber supplement every day Experiment which form you can tolerate.   There are many forms such as powders, tablets, wafers, gummies, etc Psyllium bran (Metamucil), methylcellulose (Citrucel), Miralax or Glycolax, Benefiber, Flax Seed.  Adjust the dose week-by-week (1/2 dose/day to 6 doses a day) until you are moving your bowels 1-2 times a day.  Cut back the dose or try a different fiber product if it is giving you problems such as diarrhea or bloating. Sometimes a laxative is needed to help jump-start bowels if constipated until the fiber supplement can help regulate your bowels.  If you are tolerating eating & you are farting, it is okay to try a gentle  laxative such as double dose MiraLax, prune juice, or Milk of Magnesia.  Avoid using laxatives too often. Stool softeners can sometimes help counteract the constipating effects of narcotic pain medicines.  It can also cause diarrhea, so avoid using for too long. If you are still constipated despite taking fiber daily, eating solids, and a few doses of laxatives, call our office. Controlling diarrhea Try drinking liquids and eating bland foods for a few days to avoid stressing your intestines further. Avoid dairy products (especially milk & ice cream) for a short time.  The intestines often can lose the ability to digest lactose when stressed. Avoid foods that cause gassiness or bloating.  Typical foods include beans and other legumes, cabbage, broccoli, and dairy foods.  Avoid greasy, spicy, fast foods.  Every person has some sensitivity to other foods, so listen to your body and avoid those foods that trigger problems for you. Probiotics (such as active yogurt, Align, etc) may help repopulate the intestines and colon with normal bacteria and calm down a sensitive digestive tract Adding a fiber supplement gradually can help thicken stools by absorbing excess fluid and retrain the intestines to act more normally.  Slowly increase the dose over a few weeks.  Too much fiber too soon can backfire and cause cramping & bloating. It is okay to try and slow down diarrhea with a few doses of antidiarrheal medicines.   Bismuth subsalicylate (ex. Kayopectate, Pepto Bismol) for a few doses can help control diarrhea.  Avoid if pregnant.   Loperamide (Imodium) can slow down diarrhea.  Start with one tablet (2mg) first.  Avoid if you are having fevers or severe pain.  ILEOSTOMY PATIENTS WILL HAVE CHRONIC DIARRHEA since their colon is not in use.    Drink plenty of liquids.  You will need to drink even more glasses of water/liquid a day to avoid getting dehydrated. Record output from your ileostomy.  Expect to empty  the bag every 3-4 hours at first.  Most people with a permanent ileostomy empty their bag 4-6 times at the least.   Use antidiarrheal medicine (especially Imodium) several times a day to avoid getting dehydrated.  Start with a dose at bedtime & breakfast.  Adjust up or   down as needed.  Increase antidiarrheal medications as directed to avoid emptying the bag more than 8 times a day (every 3 hours). Work with your wound ostomy nurse to learn care for your ostomy.  See ostomy care instructions. TROUBLESHOOTING IRREGULAR BOWELS 1) Start with a soft & bland diet. No spicy, greasy, or fried foods.  2) Avoid gluten/wheat or dairy products from diet to see if symptoms improve. 3) Miralax 17gm or flax seed mixed in 8oz. water or juice-daily. May use 2-4 times a day as needed. 4) Gas-X, Phazyme, etc. as needed for gas & bloating.  5) Prilosec (omeprazole) over-the-counter as needed 6)  Consider probiotics (Align, Activa, etc) to help calm the bowels down  Call your doctor if you are getting worse or not getting better.  Sometimes further testing (cultures, endoscopy, X-ray studies, CT scans, bloodwork, etc.) may be needed to help diagnose and treat the cause of the diarrhea. [facility] [address], [address], [place], [place]  [address] [phone] - Main.    [phone]  - Toll Free.   [phone] - Fax [email or web address]   ###############################   #######################################################  Ostomy Support Information  You've heard that people get along just fine with only one of their eyes, or one of their lungs, or one of their kidneys. But you also know that you have only one intestine and only one bladder, and that leaves you feeling awfully empty, both physically and emotionally: You think no other people go around without part of their intestine with the ends of their intestines sticking out through their abdominal walls.    YOU ARE NOT ALONE.  There are nearly three quarters of a million people in the [place] who have an ostomy; people who have had surgery to remove all or part of their colons or bladders.   There is even a national association, the [employer] with over 350 local affiliated support groups that are organized by volunteers who provide peer support and counseling. [employer] has a toll free telephone num-ber, 800-826-0826 and an educational, interactive website, www.ostomy.org   An ostomy is an opening in the belly (abdominal wall) made by surgery. Ostomates are people who have had this procedure. The opening (stoma) allows the kidney or bowel to grdischarge waste. An external pouch covers the stoma to collect waste. Pouches are are a simple bag and are odor free. Different companies have disposable or reusable pouches to fit one's lifestyle. An ostomy can either be temporary or permanent.   THERE ARE THREE MAIN TYPES OF OSTOMIES Colostomy. A colostomy is a surgically created opening in the large intestine (colon). Ileostomy. An ileostomy is a surgically created opening in the small intestine. Urostomy. A urostomy is a surgically created opening to divert urine away from the bladder.  OSTOMY Care  The following guidelines will make care of your colostomy easier. Keep this information close by for quick reference.  Helpful DIET hints Eat a well-balanced diet including vegetables and fresh fruits. Eat on a regular schedule.  Drink at least 6 to 8 glasses of fluids daily. Eat slowly in a relaxed atmosphere. Chew your food thoroughly. Avoid chewing gum, smoking, and drinking from a straw. This will help decrease the amount of air you swallow, which may help reduce gas. Eating yogurt or drinking buttermilk may help reduce gas.  To control gas at night, do not eat after 8 p.m. This will give your bowel time to quiet down before you go   to bed.  If gas is a problem, you can purchase  Beano. Sprinkle Beano on the first bite of food before eating to reduce gas. It has no flavor and should not change the taste of your food. You can buy Beano over the counter at your local drugstore.  Foods like fish, onions, garlic, broccoli, asparagus, and cabbage produce odor. Although your pouch is odor-proof, if you eat these foods you may notice a stronger odor when emptying your pouch. If this is a concern, you may want to limit these foods in your diet.  If you have an ileostomy, you will have chronic diarrhea & need to drink more liquids to avoid getting dehydrated.  Consider antidiarrheal medicine like imodium (loperamide) or Lomotil to help slow down bowel movements / diarrhea into your ileostomy bag.  GETTING TO GOOD BOWEL HEALTH WITH AN ILEOSTOMY    With the colon bypassed & not in use, you will have small bowel diarrhea.   It is important to thicken & slow your bowel movements down.   The goal: 4-6 small BOWEL MOVEMENTS A DAY It is important to drink plenty of liquids to avoid getting dehydrated  CONTROLLING ILEOSTOMY DIARRHEA  TAKE A FIBER SUPPLEMENT (FiberCon or Benefiner soluble fiber) twice a day - to thicken stools by absorbing excess fluid and retrain the intestines to act more normally.  Slowly increase the dose over a few weeks.  Too much fiber too soon can backfire and cause cramping & bloating.  TAKE AN IRON SUPPLEMENT twice a day to naturally constipate your bowels.  Usually ferrous sulfate 325mg twice a day)  TAKE ANTI-DIARRHEAL MEDICINES: Loperamide (Imodium) can slow down diarrhea.  Start with two tablets (= 4mg) first and then try one tablet every 6 hours.  Can go up to 2 pills four times day (8 pills of 2mg max) Avoid if you are having fevers or severe pain.  If you are not better or start feeling worse, stop all medicines and call your doctor for advice LoMotil (Diphenoxylate / Atropine) is another medicine that can constipate & slow down bowel moevements Pepto  Bismol (bismuth) can gently thicken bowels as well  If diarrhea is worse,: drink plenty of liquids and try simpler foods for a few days to avoid stressing your intestines further. Avoid dairy products (especially milk & ice cream) for a short time.  The intestines often can lose the ability to digest lactose when stressed. Avoid foods that cause gassiness or bloating.  Typical foods include beans and other legumes, cabbage, broccoli, and dairy foods.  Every person has some sensitivity to other foods, so listen to our body and avoid those foods that trigger problems for you.Call your doctor if you are getting worse or not better.  Sometimes further testing (cultures, endoscopy, X-ray studies, bloodwork, etc) may be needed to help diagnose and treat the cause of the diarrhea. Take extra anti-diarrheal medicines (maximum is 8 pills of 2mg loperamide a day)   Tips for POUCHING an OSTOMY   Changing Your Pouch The best time to change your pouch is in the morning, before eating or drinking anything. Your stoma can function at any time, but it will function more after eating or drinking.   Applying the pouching system  Place all your equipment close at hand before removing your pouch.  Wash your hands.  Stand or sit in front of a mirror. Use the position that works best for you. Remember that you must keep the skin around the stoma   wrinkle-free for a good seal.  Gently remove the used pouch (1-piece system) or the pouch and old wafer (2-piece system). Empty the pouch into the toilet. Save the closure clip to use again.  Wash the stoma itself and the skin around the stoma. Your stoma may bleed a little when being washed. This is normal. Rinse and pat dry. You may use a wash cloth or soft paper towels (like Bounty), mild soap (like Dial, Safeguard, or Ivory), and water. Avoid soaps that contain perfumes or lotions.  For a new pouch (1-piece system) or a new wafer (2-piece system), measure your  stoma using the stoma guide in each box of supplies.  Trace the shape of your stoma onto the back of the new pouch or the back of the new wafer. Cut out the opening. Remove the paper backing and set it aside.  Optional: Apply a skin barrier powder to surrounding skin if it is irritated (bare or weeping), and dust off the excess. Optional: Apply a skin-prep wipe (such as Skin Prep or All-Kare) to the skin around the stoma, and let it dry. Do not apply this solution if the skin is irritated (red, tender, or broken) or if you have shaved around the stoma. Optional: Apply a skin barrier paste (such as Stomahesive, Coloplast, or Premium) around the opening cut in the back of the pouch or wafer. Allow it to dry for 30 to 60 seconds.  Hold the pouch (1-piece system) or wafer (2-piece system) with the sticky side toward your body. Make sure the skin around the stoma is wrinkle-free. Center the opening on the stoma, then press firmly to your abdomen (Fig. 4). Look in the mirror to check if you are placing the pouch, or wafer, in the right position. For a 2-piece system, snap the pouch onto the wafer. Make sure it snaps into place securely.  Place your hand over the stoma and the pouch or wafer for about 30 seconds. The heat from your hand can help the pouch or wafer stick to your skin.  Add deodorant (such as Super Banish or Nullo) to your pouch. Other options include food extracts such as vanilla oil and peppermint extract. Add about 10 drops of the deodorant to the pouch. Then apply the closure clamp. Note: Do not use toxic  chemicals or commercial cleaning agents in your pouch. These substances may harm the stoma.  Optional: For extra seal, apply tape to all 4 sides around the pouch or wafer, as if you were framing a picture. You may use any brand of medical adhesive tape. Change your pouch every 5 to 7 days. Change it immediately if a leak occurs.  Wash your hands afterwards.  If you are wearing a  2-piece system, you may use 2 new pouches per week and alternate them. Rinse the pouch with mild soap and warm water and hang it to dry for the next day. Apply the fresh pouch. Alternate the 2 pouches like this for a week. After a week, change the wafer and begin with 2 new pouches. Place the old pouches in a plastic bag, and put them in the trash.   LIVING WITH AN OSTOMY  Emptying Your Pouch Empty your pouch when it is one-third full (of urine, stool, and/or gas). If you wait until your pouch is fuller than this, it will be more difficult to empty and more noticeable. When you empty your pouch, either put toilet paper in the toilet bowl first, or flush the   toilet while you empty the pouch. This will reduce splashing. You can empty the pouch between your legs or to one side while sitting, or while standing or stooping. If you have a 2-piece system, you can snap off the pouch to empty it. Remember that your stoma may function during this time. If you wish to rinse your pouch after you empty it, a turkey baster can be helpful. When using a baster, squirt water up into the pouch through the opening at the bottom. With a 2-piece system, you can snap off the pouch to rinse it. After rinsing  your pouch, empty it into the toilet. When rinsing your pouch at home, put a few granules of Dreft soap in the rinse water. This helps lubricate and freshen your pouch. The inside of your pouch can be sprayed with non-stick cooking oil (Pam spray). This may help reduce stool sticking to the inside of the pouch.  Bathing You may shower or bathe with your pouch on or off. Remember that your stoma may function during this time.  The materials you use to wash your stoma and the skin around it should be clean, but they do not need to be sterile.  Wearing Your Pouch During hot weather, or if you perspire a lot in general, wear a cover over your pouch. This may prevent a rash on your skin under the pouch. Pouch covers are  sold at ostomy supply stores. Wear the pouch inside your underwear for better support. Watch your weight. Any gain or loss of 10 to 15 pounds or more can change the way your pouch fits.  Going Away From Home A collapsible cup (like those that come in travel kits) or a soft plastic squirt bottle with a pull-up top (like a travel bottle for shampoo) can be used for rinsing your pouch when you are away from home. Tilt the opening of the pouch at an upward angle when using a cup to rinse.  Carry wet wipes or extra tissues to use in public bathrooms.  Carry an extra pouching system with you at all times.  Never keep ostomy supplies in the glove compartment of your car. Extreme heat or cold can damage the skin barriers and adhesive wafers on the pouch.  When you travel, carry your ostomy supplies with you at all times. Keep them within easy reach. Do not pack ostomy supplies in baggage that will be checked or otherwise separated from you, because your baggage might be lost. If you're traveling out of the country, it is helpful to have a letter stating that you are carrying ostomy supplies as a medical necessity.  If you need ostomy supplies while traveling, look in the yellow pages of the telephone book under "Surgical Supplies." Or call the local ostomy organization to find out where supplies are available.  Do not let your ostomy supplies get low. Always order new pouches before you use the last one.  Reducing Odor Limit foods such as broccoli, cabbage, onions, fish, and garlic in your diet to help reduce odor. Each time you empty your pouch, carefully clean the opening of the pouch, both inside and outside, with toilet paper. Rinse your pouch 1 or 2 times daily after you empty it (see directions for emptying your pouch and going away from home). Add deodorant (such as Super Banish or Nullo) to your pouch. Use air deodorizers in your bathroom. Do not add aspirin to your pouch. Even though  aspirin can help prevent odor, it   could cause ulcers on your stoma.  When to call the doctor Call the doctor if you have any of the following symptoms: Purple, black, or white stoma Severe cramps lasting more than 6 hours Severe watery discharge from the stoma lasting more than 6 hours No output from the colostomy for 3 days Excessive bleeding from your stoma Swelling of your stoma to more than 1/2-inch larger than usual Pulling inward of your stoma below skin level Severe skin irritation or deep ulcers Bulging or other changes in your abdomen  When to call your ostomy nurse Call your ostomy/enterostomal therapy (WOCN) nurse if any of the following occurs: Frequent leaking of your pouching system Change in size or appearance of your stoma, causing discomfort or problems with your pouch Skin rash or rawness Weight gain or loss that causes problems with your pouch     FREQUENTLY ASKED QUESTIONS   Why haven't you met any of these folks who have an ostomy?  Well, maybe you have! You just did not recognize them because an ostomy doesn't show. It can be kept secret if you wish. Why, maybe some of your best friends, office associates or neighbors have an ostomy ... you never can tell. People facing ostomy surgery have many quality-of-life questions like: Will you bulge? Smell? Make noises? Will you feel waste leaving your body? Will you be a captive of the toilet? Will you starve? Be a social outcast? Get/stay married? Have babies? Easily bathe, go swimming, bend over?  OK, let's look at what you can expect:   Will you bulge?  Remember, without part of the intestine or bladder, and its contents, you should have a flatter tummy than before. You can expect to wear, with little exception, what you wore before surgery ... and this in-cludes tight clothing and bathing suits.   Will you smell?  Today, thanks to modern odor proof pouching systems, you can walk into an ostomy support group  meeting and not smell anything that is foul or offensive. And, for those with an ileostomy or colostomy who are concerned about odor when emptying their pouch, there are in-pouch deodorants that can be used to eliminate any waste odors that may exist.   Will you make noises?  Everyone produces gas, especially if they are an air-swallower. But intestinal sounds that occur from time to time are no differ-ent than a gurgling tummy, and quite often your clothing will muffle any sounds.   Will you feel the waste discharges?  For those with a colostomy or ileostomy there might be a slight pressure when waste leaves your body, but understand that the intestines have no nerve endings, so there will be no unpleasant sensations. Those with a urostomy will probably be unaware of any kidney drainage.   Will you be a captive of the toilet?  Immediately post-op you will spend more time in the bathroom than you will after your body recovers from surgery. Every person is different, but on average those with an ileostomy or urostomy may empty their pouches 4 to 6 times a day; a little  less if you have a colostomy. The average wear time between pouch system changes is 3 to 5 days and the changing process should take less than 30 minutes.   Will I need to be on a special diet? Most people return to their normal diet when they have recovered from surgery. Be sure to chew your food well, eat a well-balanced diet and drink plenty of fluids. If   you experience problems with a certain food, wait a couple of weeks and try it again.  Will there be odor and noises? Pouching systems are designed to be odor-proof or odor-resistant. There are deodorants that can be used in the pouch. Medications are also available to help reduce odor. Limit gas-producing foods and carbonated beverages. You will experience less gas and fewer noises as you heal from surgery.  How much time will it take to care for my ostomy? At first, you may  spend a lot of time learning about your ostomy and how to take care of it. As you become more comfortable and skilled at changing the pouching system, it will take very little time to care for it.   Will I be able to return to work? People with ostomies can perform most jobs. As soon as you have healed from surgery, you should be able to return to work. Heavy lifting (more than 10 pounds) may be discouraged.   What about intimacy? Sexual relationships and intimacy are important and fulfilling aspects of your life. They should continue after ostomy surgery. Intimacy-related concerns should be discussed openly between you and your partner.   Can I wear regular clothing? You do not need to wear special clothing. Ostomy pouches are fairly flat and barely noticeable. Elastic undergarments will not hurt the stoma or prevent the ostomy from functioning.   Can I participate in sports? An ostomy should not limit your involvement in sports. Many people with ostomies are runners, skiers, swimmers or participate in other active lifestyles. Talk with your caregiver first before doing heavy physical activity.  Will you starve?  Not if you follow doctor's orders at each stage of your post-op adjustment. There is no such thing as an "ostomy diet". Some people with an ostomy will be able to eat and tolerate anything; others may find diffi-culty with some foods. Each person is an individual and must determine, by trial, what is best for them. A good practice for all is to drink plenty of water.   Will you be a social outcast?  Have you met anyone who has an ostomy and is a social outcast? Why should you be the first? Only your attitude and self image will effect how you are treated. No confi-dent person is an outcast.    PROFESSIONAL HELP   Resources are available if you need help or have questions about your ostomy.   Specially trained nurses called Wound, Ostomy Continence Nurses (WOCN) are available for  consultation in most major medical centers.  Consider getting an ostomy consult at an outpatient ostomy clinic.   [place] has an [facility] run by an WOCN ostomy nurse at the [facility].  [phone]. [facility] can help set up an appointment   The [employer] (UOA) is a group made up of many local chapters throughout the [place]. These local groups hold meetings and provide support to prospective and existing ostomates. They sponsor educational events and have qualified visitors to make personal or telephone visits. Contact the UOA for the chapter nearest you and for other educational publications.  More detailed information can be found in Colostomy Guide, a publication of the [employer] (UOA). Contact UOA at 1-800-826-0826 or visit their web site at www.uoaa.org. The website contains links to other sites, suppliers and resources.  [facility] Secure Start Services: Start at the website to enlist for support.  Your Wound Ostomy (WOCN) nurse may have started this   process. https://[email or web address].[employer].com/en/securestart Secure Start services are designed to support people as they live their lives with an ostomy or neurogenic bladder. Enrolling is easy and at no cost to the patient. We realize that each person's needs and life journey are different. Through Secure Start services, we want to help people live their life, their way.  #######################################################  

## 2022-07-09 NOTE — Plan of Care (Signed)

## 2022-07-09 NOTE — Progress Notes (Signed)
Reviewed written d/c instructions w pt and all questions answered. He verbalized understanding. D/ C via w/c w all belongings in stable condition. 

## 2022-07-12 LAB — SURGICAL PATHOLOGY

## 2022-07-20 ENCOUNTER — Other Ambulatory Visit: Payer: Self-pay

## 2022-07-20 NOTE — Progress Notes (Signed)
The proposed treatment discussed in conference is for discussion purpose only and is not a binding recommendation.  The patients have not been physically examined, or presented with their treatment options.  Therefore, final treatment plans cannot be decided.  

## 2023-12-19 ENCOUNTER — Encounter (HOSPITAL_COMMUNITY): Payer: Self-pay | Admitting: Adult Health

## 2023-12-27 ENCOUNTER — Other Ambulatory Visit (HOSPITAL_COMMUNITY): Payer: Self-pay | Admitting: Adult Health

## 2023-12-27 DIAGNOSIS — Z86718 Personal history of other venous thrombosis and embolism: Secondary | ICD-10-CM

## 2023-12-28 ENCOUNTER — Ambulatory Visit (HOSPITAL_COMMUNITY)
Admission: RE | Admit: 2023-12-28 | Discharge: 2023-12-28 | Disposition: A | Discharge status: Home / Self Care | Payer: Medicare (Managed Care) | Source: Ambulatory Visit | Attending: Adult Health | Admitting: Adult Health

## 2023-12-28 DIAGNOSIS — Z86718 Personal history of other venous thrombosis and embolism: Secondary | ICD-10-CM | POA: Diagnosis present
